# Patient Record
Sex: Male | Born: 1955 | Race: White | Hispanic: No | Smoking: Former smoker
Health system: Southern US, Community
[De-identification: ages and names within clinical notes are randomized; demographics above are authoritative.]

## PROBLEM LIST (undated history)

## (undated) DIAGNOSIS — R7303 Prediabetes: Secondary | ICD-10-CM

## (undated) DIAGNOSIS — I4891 Unspecified atrial fibrillation: Secondary | ICD-10-CM

## (undated) DIAGNOSIS — J449 Chronic obstructive pulmonary disease, unspecified: Secondary | ICD-10-CM

## (undated) DIAGNOSIS — K219 Gastro-esophageal reflux disease without esophagitis: Secondary | ICD-10-CM

## (undated) DIAGNOSIS — I2699 Other pulmonary embolism without acute cor pulmonale: Secondary | ICD-10-CM

## (undated) DIAGNOSIS — I10 Essential (primary) hypertension: Secondary | ICD-10-CM

## (undated) HISTORY — PX: CARDIOVERSION: SHX1299

## (undated) HISTORY — PX: ANKLE ARTHROSCOPY: SUR85

## (undated) HISTORY — DX: Other pulmonary embolism without acute cor pulmonale: I26.99

## (undated) HISTORY — PX: NISSEN FUNDOPLICATION: SHX2091

## (undated) HISTORY — DX: Unspecified atrial fibrillation: I48.91

---

## 1999-11-01 HISTORY — PX: ANKLE ARTHROSCOPY: SUR85

## 2004-10-31 HISTORY — PX: NISSEN FUNDOPLICATION: SHX2091

## 2004-11-23 ENCOUNTER — Ambulatory Visit: Payer: Self-pay | Admitting: Unknown Physician Specialty

## 2004-12-13 ENCOUNTER — Ambulatory Visit: Payer: Self-pay | Admitting: Unknown Physician Specialty

## 2005-01-07 ENCOUNTER — Ambulatory Visit: Payer: Self-pay | Admitting: Unknown Physician Specialty

## 2005-01-26 ENCOUNTER — Ambulatory Visit: Payer: Self-pay | Admitting: Unknown Physician Specialty

## 2005-06-06 ENCOUNTER — Ambulatory Visit: Payer: Self-pay | Admitting: Cardiology

## 2005-07-13 ENCOUNTER — Ambulatory Visit: Payer: Self-pay | Admitting: Surgery

## 2007-11-01 DIAGNOSIS — R569 Unspecified convulsions: Secondary | ICD-10-CM

## 2007-11-01 HISTORY — DX: Unspecified convulsions: R56.9

## 2007-11-01 HISTORY — PX: KNEE ARTHROSCOPY: SHX127

## 2012-05-31 ENCOUNTER — Ambulatory Visit: Payer: Self-pay | Admitting: Unknown Physician Specialty

## 2012-06-01 LAB — PATHOLOGY REPORT

## 2014-08-12 ENCOUNTER — Ambulatory Visit: Payer: Self-pay | Admitting: Family Medicine

## 2014-09-17 ENCOUNTER — Ambulatory Visit: Payer: Self-pay | Admitting: Unknown Physician Specialty

## 2014-09-23 DIAGNOSIS — F329 Major depressive disorder, single episode, unspecified: Secondary | ICD-10-CM | POA: Insufficient documentation

## 2014-09-23 DIAGNOSIS — K219 Gastro-esophageal reflux disease without esophagitis: Secondary | ICD-10-CM | POA: Insufficient documentation

## 2014-09-23 DIAGNOSIS — F32A Depression, unspecified: Secondary | ICD-10-CM | POA: Insufficient documentation

## 2014-09-29 DIAGNOSIS — E7800 Pure hypercholesterolemia, unspecified: Secondary | ICD-10-CM | POA: Insufficient documentation

## 2014-10-17 ENCOUNTER — Ambulatory Visit: Payer: Self-pay | Admitting: Unknown Physician Specialty

## 2014-10-23 DIAGNOSIS — M2391 Unspecified internal derangement of right knee: Secondary | ICD-10-CM | POA: Insufficient documentation

## 2015-10-02 DIAGNOSIS — R6889 Other general symptoms and signs: Secondary | ICD-10-CM | POA: Insufficient documentation

## 2016-05-28 ENCOUNTER — Emergency Department
Admission: EM | Admit: 2016-05-28 | Discharge: 2016-05-29 | Disposition: A | Discharge status: Home / Self Care | Payer: BLUE CROSS/BLUE SHIELD | Attending: Student | Admitting: Student

## 2016-05-28 ENCOUNTER — Encounter: Payer: Self-pay | Admitting: Emergency Medicine

## 2016-05-28 DIAGNOSIS — R109 Unspecified abdominal pain: Secondary | ICD-10-CM | POA: Diagnosis present

## 2016-05-28 DIAGNOSIS — N200 Calculus of kidney: Secondary | ICD-10-CM | POA: Insufficient documentation

## 2016-05-28 DIAGNOSIS — Z87891 Personal history of nicotine dependence: Secondary | ICD-10-CM | POA: Insufficient documentation

## 2016-05-28 HISTORY — DX: Gastro-esophageal reflux disease without esophagitis: K21.9

## 2016-05-28 MED ORDER — SODIUM CHLORIDE 0.9 % IV BOLUS (SEPSIS)
1000.0000 mL | Freq: Once | INTRAVENOUS | Status: AC
  Administered 2016-05-29: 1000 mL via INTRAVENOUS

## 2016-05-28 MED ORDER — ONDANSETRON HCL 4 MG/2ML IJ SOLN
4.0000 mg | Freq: Once | INTRAMUSCULAR | Status: AC
  Administered 2016-05-29: 4 mg via INTRAVENOUS

## 2016-05-28 MED ORDER — MORPHINE SULFATE (PF) 4 MG/ML IV SOLN
4.0000 mg | Freq: Once | INTRAVENOUS | Status: AC
Start: 2016-05-29 — End: 2016-05-29
  Administered 2016-05-29: 4 mg via INTRAVENOUS

## 2016-05-28 NOTE — ED Triage Notes (Signed)
Patient presents to Emergency Department via EMS with complaints of right side flank pain.  EMS gave fentanyl last dose at 2330.   Pt no hx of kidney stones.  Pt reports hx of Nisan Fundiplication,  GERD.    Pt sweaty and writhing in pain.

## 2016-05-29 ENCOUNTER — Emergency Department: Payer: BLUE CROSS/BLUE SHIELD

## 2016-05-29 LAB — URINALYSIS COMPLETE WITH MICROSCOPIC (ARMC ONLY)
BILIRUBIN URINE: NEGATIVE
Bacteria, UA: NONE SEEN
Glucose, UA: NEGATIVE mg/dL
KETONES UR: NEGATIVE mg/dL
Leukocytes, UA: NEGATIVE
NITRITE: NEGATIVE
PROTEIN: NEGATIVE mg/dL
SPECIFIC GRAVITY, URINE: 1.015 (ref 1.005–1.030)
Squamous Epithelial / LPF: NONE SEEN
pH: 5 (ref 5.0–8.0)

## 2016-05-29 LAB — COMPREHENSIVE METABOLIC PANEL
ALBUMIN: 4.1 g/dL (ref 3.5–5.0)
ALK PHOS: 36 U/L — AB (ref 38–126)
ALT: 16 U/L — ABNORMAL LOW (ref 17–63)
ANION GAP: 9 (ref 5–15)
AST: 21 U/L (ref 15–41)
BUN: 21 mg/dL — ABNORMAL HIGH (ref 6–20)
CALCIUM: 9 mg/dL (ref 8.9–10.3)
CO2: 23 mmol/L (ref 22–32)
Chloride: 110 mmol/L (ref 101–111)
Creatinine, Ser: 1.04 mg/dL (ref 0.61–1.24)
GFR calc Af Amer: >60 mL/min (ref >60)
GFR calc non Af Amer: >60 mL/min (ref >60)
GLUCOSE: 113 mg/dL — AB (ref 65–99)
Potassium: 3.7 mmol/L (ref 3.5–5.1)
Sodium: 142 mmol/L (ref 135–145)
Total Bilirubin: 0.6 mg/dL (ref 0.3–1.2)
Total Protein: 6.4 g/dL — ABNORMAL LOW (ref 6.5–8.1)

## 2016-05-29 LAB — CBC WITH DIFFERENTIAL/PLATELET
Basophils Absolute: 0.1 10*3/uL (ref 0–0.1)
Basophils Relative: 1 %
EOS PCT: 2 %
Eosinophils Absolute: 0.2 10*3/uL (ref 0–0.7)
HEMATOCRIT: 41.1 % (ref 40.0–52.0)
Hemoglobin: 14.3 g/dL (ref 13.0–18.0)
LYMPHS ABS: 1.9 10*3/uL (ref 1.0–3.6)
LYMPHS PCT: 16 %
MCH: 30.5 pg (ref 26.0–34.0)
MCHC: 34.8 g/dL (ref 32.0–36.0)
MCV: 87.5 fL (ref 80.0–100.0)
MONO ABS: 0.8 10*3/uL (ref 0.2–1.0)
MONOS PCT: 7 %
NEUTROS ABS: 8.4 10*3/uL — AB (ref 1.4–6.5)
Neutrophils Relative %: 74 %
PLATELETS: 204 10*3/uL (ref 150–440)
RBC: 4.69 MIL/uL (ref 4.40–5.90)
RDW: 12.2 % (ref 11.5–14.5)
WBC: 11.4 10*3/uL — ABNORMAL HIGH (ref 3.8–10.6)

## 2016-05-29 LAB — LIPASE, BLOOD: Lipase: 22 U/L (ref 11–51)

## 2016-05-29 MED ORDER — ONDANSETRON 4 MG PO TBDP: 4.0000 mg | ORAL_TABLET | Freq: Three times a day (TID) | ORAL | 0 refills | Status: DC | PRN

## 2016-05-29 MED ORDER — OXYCODONE HCL 5 MG PO TABS: 5.0000 mg | ORAL_TABLET | Freq: Four times a day (QID) | ORAL | 0 refills | Status: DC | PRN

## 2016-05-29 MED ORDER — ONDANSETRON HCL 4 MG/2ML IJ SOLN
INTRAMUSCULAR | Status: AC
Start: 2016-05-29 — End: 2016-05-29
  Administered 2016-05-29: 4 mg via INTRAVENOUS
  Filled 2016-05-29: qty 2

## 2016-05-29 MED ORDER — KETOROLAC TROMETHAMINE 30 MG/ML IJ SOLN
15.0000 mg | Freq: Once | INTRAMUSCULAR | Status: AC
  Administered 2016-05-29: 15 mg via INTRAVENOUS
  Filled 2016-05-29: qty 1

## 2016-05-29 MED ORDER — MORPHINE SULFATE (PF) 4 MG/ML IV SOLN
INTRAVENOUS | Status: AC
  Administered 2016-05-29: 4 mg via INTRAVENOUS
  Filled 2016-05-29: qty 1

## 2016-05-29 MED ORDER — TAMSULOSIN HCL 0.4 MG PO CAPS: 0.4000 mg | ORAL_CAPSULE | Freq: Every day | ORAL | 0 refills | Status: DC

## 2016-05-29 NOTE — ED Provider Notes (Signed)
[facility] Emergency Department Provider Note   ____________________________________________   First MD Initiated Contact with Patient 05/29/16 0008     (approximate)  I have reviewed the triage vital signs and the nursing notes.   HISTORY  Chief Complaint Flank Pain    HPI Dwayne James is a 60 y.o. male with history of GERD presents for evaluation of severe sudden onset right flank pain this evening, gradual onset, radiating into the right abdomen, constant, severe, no modifying factors. He has had nausea but no vomiting, diarrhea, fevers or chills. No chest pain or difficulty breathing. No symptoms like this previously.   Past Medical History:  Diagnosis Date  . GERD (gastroesophageal reflux disease)     There are no active problems to display for this patient.   Past Surgical History:  Procedure Laterality Date  . ANKLE ARTHROSCOPY    . NISSEN FUNDOPLICATION      Prior to Admission medications   Not on File    Allergies Review of patient's allergies indicates no known allergies.  History reviewed. No pertinent family history.  Social History Social History  Substance Use Topics  . Smoking status: Former Smoker    Quit date: 05/28/2004  . Smokeless tobacco: Never Used  . Alcohol use No    Review of Systems Constitutional: No fever/chills Eyes: No visual changes. ENT: No sore throat. Cardiovascular: Denies chest pain. Respiratory: Denies shortness of breath. Gastrointestinal: No abdominal pain.  + nausea, no vomiting.  No diarrhea.  No constipation. Genitourinary: Negative for dysuria. Musculoskeletal: Positive for flank pain. Skin: Negative for rash. Neurological: Negative for headaches, focal weakness or numbness.  10-point ROS otherwise negative.  ____________________________________________   PHYSICAL EXAM:  VITAL SIGNS: ED Triage Vitals  Enc Vitals Group     BP 05/28/16 2338 (!) 159/84     Pulse Rate  05/28/16 2338 60     Resp 05/28/16 2338 20     Temp 05/28/16 2338 98.2 F (36.8 C)     Temp Source 05/28/16 2338 Oral     SpO2 05/28/16 2338 100 %     Weight 05/28/16 2338 228 lb (103.4 kg)     Height 05/28/16 2338 5\' 8"  (1.727 m)     Head Circumference --      Peak Flow --      Pain Score 05/28/16 2339 10     Pain Loc --      Pain Edu? --      Excl. in GC? --     Constitutional: Alert and oriented. In severe distress second to pain, writhing in bed unable to find a comfortable position. Eyes: Conjunctivae are normal. PERRL. EOMI. Head: Atraumatic. Nose: No congestion/rhinnorhea. Mouth/Throat: Mucous membranes are moist.  Oropharynx non-erythematous. Neck: No stridor. Cardiovascular: Normal rate, regular rhythm. Grossly normal heart sounds.  Good peripheral circulation. Respiratory: Normal respiratory effort.  No retractions. Lungs CTAB. Gastrointestinal: Soft and nontender. No distention. No abdominal bruits. No CVA tenderness. Genitourinary: deferred Musculoskeletal: No lower extremity tenderness nor edema.  No joint effusions. Neurologic:  Normal speech and language. No gross focal neurologic deficits are appreciated. No gait instability. Skin:  Skin is warm, dry and intact. No rash noted. Psychiatric: Mood and affect are normal. Speech and behavior are normal.  ____________________________________________   LABS (all labs ordered are listed, but only abnormal results are displayed)  Labs Reviewed  URINALYSIS COMPLETEWITH MICROSCOPIC ([facility] ONLY) - Abnormal; Notable for the following:       Result Value  Color, Urine YELLOW (*)    APPearance CLEAR (*)    Hgb urine dipstick 2+ (*)    All other components within normal limits  CBC WITH DIFFERENTIAL/PLATELET - Abnormal; Notable for the following:    WBC 11.4 (*)    Neutro Abs 8.4 (*)    All other components within normal limits  COMPREHENSIVE METABOLIC PANEL - Abnormal; Notable for the following:    Glucose, Bld 113  (*)    BUN 21 (*)    Total Protein 6.4 (*)    ALT 16 (*)    Alkaline Phosphatase 36 (*)    All other components within normal limits  LIPASE, BLOOD   ____________________________________________  EKG  none ____________________________________________  RADIOLOGY  CT abdomen and pelvis IMPRESSION: 1. There is right renal swelling and perinephric stranding, but no hydronephrosis or hydroureter. No renal or ureteral stone. Findings could reflect a recently passed stone. Alternatively, findings may reflect pyelonephritis. No other evidence of an acute abnormality. 2. No other significant findings. ____________________________________________   PROCEDURES  Procedure(s) performed: None  Procedures  Critical Care performed: No  ____________________________________________   INITIAL IMPRESSION / ASSESSMENT AND PLAN / ED COURSE  Pertinent labs & imaging results that were available during my care of the patient were reviewed by me and considered in my medical decision making (see chart for details).  Dwayne James is a 60 y.o. male with history of GERD presents for evaluation of severe sudden onset right flank pain this evening, gradual onset, radiating into the right abdomen. On exam, he appears to be in distress secondary to pain. Vital signs stable, he is afebrile. Clinical concern is for possible kidney stone. We'll treat symptomatically, obtain labs, CT abdomen and pelvis, UA and reassess for disposition.  ----------------------------------------- 3:34 AM on 05/29/2016 ----------------------------------------- Patient reports he is feeling much better at this time after 1 dose of morphine followed by a dose of Toradol as well as IV fluids. He is requesting discharge. CBC with mild leukocytosis, normal lipase, and generally unremarkable CMP. Urinalysis with 6-30 red blood cells, no leukocytes sites, nitrites and no bacteria, not consistent with infection. I suspect  this blood is secondary to a passed  kidney stone as CT scan shows some perinephric stranding consistent with that, though pyelonephritis is not excluded on the CT scan. Urinalysis is not consistent with infection and I doubt this represents pyelonephritis. We'll discharge with pain control, Flomax, close urology follow-up. We discussed return precautions and the patient is comfortable with the discharge plan. DC home.   Clinical Course     ____________________________________________   FINAL CLINICAL IMPRESSION(S) / ED DIAGNOSES  Final diagnoses:  Right flank pain  Nephrolithiasis      NEW MEDICATIONS STARTED DURING THIS VISIT:  New Prescriptions   No medications on file     Note:  This document was prepared using Dragon voice recognition software and may include unintentional dictation errors.    Gayla Doss, MD 05/29/16 [phone]

## 2017-06-08 DIAGNOSIS — M545 Low back pain, unspecified: Secondary | ICD-10-CM | POA: Insufficient documentation

## 2017-06-08 DIAGNOSIS — F1190 Opioid use, unspecified, uncomplicated: Secondary | ICD-10-CM | POA: Insufficient documentation

## 2017-06-08 DIAGNOSIS — G8929 Other chronic pain: Secondary | ICD-10-CM | POA: Insufficient documentation

## 2017-06-08 DIAGNOSIS — M722 Plantar fascial fibromatosis: Secondary | ICD-10-CM | POA: Insufficient documentation

## 2017-06-13 DIAGNOSIS — R7302 Impaired glucose tolerance (oral): Secondary | ICD-10-CM | POA: Insufficient documentation

## 2017-06-15 DIAGNOSIS — Z9114 Patient's other noncompliance with medication regimen: Secondary | ICD-10-CM | POA: Insufficient documentation

## 2017-08-04 ENCOUNTER — Ambulatory Visit (INDEPENDENT_AMBULATORY_CARE_PROVIDER_SITE_OTHER): Payer: BLUE CROSS/BLUE SHIELD

## 2017-08-04 ENCOUNTER — Other Ambulatory Visit: Payer: Self-pay

## 2017-08-04 ENCOUNTER — Ambulatory Visit (INDEPENDENT_AMBULATORY_CARE_PROVIDER_SITE_OTHER): Payer: BLUE CROSS/BLUE SHIELD | Admitting: Podiatry

## 2017-08-04 VITALS — BP 139/94 | HR 76 | Temp 97.8°F

## 2017-08-04 DIAGNOSIS — M79673 Pain in unspecified foot: Secondary | ICD-10-CM | POA: Diagnosis not present

## 2017-08-04 DIAGNOSIS — G5761 Lesion of plantar nerve, right lower limb: Secondary | ICD-10-CM

## 2017-08-04 DIAGNOSIS — M722 Plantar fascial fibromatosis: Secondary | ICD-10-CM | POA: Diagnosis not present

## 2017-08-04 MED ORDER — DICLOFENAC SODIUM 75 MG PO TBEC: 75.0000 mg | DELAYED_RELEASE_TABLET | Freq: Two times a day (BID) | ORAL | 1 refills | Status: DC

## 2017-08-07 NOTE — Progress Notes (Signed)
   Subjective: Patient presents today for intermittent pain and tenderness in the left heel, arch and forefoot that began 3-4 months ago. He also reports intermittent pain to the right forefoot for the past week. He states the left foot hurts more than the right. Patient states that it hurts in the mornings with the first steps out of bed. Patient presents today for further treatment and evaluation.   Past Medical History:  Diagnosis Date  . GERD (gastroesophageal reflux disease)     Objective: Physical Exam General: The patient is alert and oriented x3 in no acute distress.  Dermatology: Skin is warm, dry and supple bilateral lower extremities. Negative for open lesions or macerations bilateral.   Vascular: Dorsalis Pedis and Posterior Tibial pulses palpable bilateral.  Capillary fill time is immediate to all digits.  Neurological: Epicritic and protective threshold intact bilateral.   Musculoskeletal: Tenderness to palpation at the medial calcaneal tubercale and through the insertion of the plantar fascia of the left foot.  Sharp pain with palpation of the 2nd interspace of the right foot and lateral compression of the metatarsal heads consistent with neuroma. Positive Lendell Caprice sign with loadbearing of the forefoot. All other joints range of motion within normal limits bilateral. Strength 5/5 in all groups bilateral.   Radiographic exam:   Normal osseous mineralization. Joint spaces preserved. No fracture/dislocation/boney destruction. Calcaneal spur present with mild thickening of plantar fascia left. No other soft tissue abnormalities or radiopaque foreign bodies.   Assessment: 1. Plantar fasciitis left foot 2. Morton's neuroma right second interspace  Plan of Care:  1. Patient evaluated. Xrays reviewed.   2. Injection of 0.5cc Celestone soluspan injected into the left plantar fascia.  3. Injection of 0.5 mL Celestone Soluspan injected into the second interspace of the right  foot in the neuroma. 4. Rx for Diclofenac  PO BID ordered for patient. 5. Plantar fascial band(s) dispensed  6. Instructed patient regarding therapies and modalities at home to alleviate symptoms.  7. Return to clinic in 4 weeks.     Felecia Shelling, DPM [facility]  Dr. Felecia Shelling, DPM    [address] [place], [place] [address]                Office [phone]  Fax [phone]

## 2017-08-15 MED ORDER — BETAMETHASONE SOD PHOS & ACET 6 (3-3) MG/ML IJ SUSP: 3.0000 mg | Freq: Once | INTRAMUSCULAR | Status: DC

## 2017-09-01 ENCOUNTER — Encounter: Payer: Self-pay | Admitting: Podiatry

## 2017-09-01 ENCOUNTER — Ambulatory Visit (INDEPENDENT_AMBULATORY_CARE_PROVIDER_SITE_OTHER): Payer: BLUE CROSS/BLUE SHIELD | Admitting: Podiatry

## 2017-09-01 DIAGNOSIS — M722 Plantar fascial fibromatosis: Secondary | ICD-10-CM

## 2017-09-04 NOTE — Progress Notes (Signed)
   Subjective: Patient presents today for follow up evaluation of left plantar fasciitis. He states the injection he received previously helped alleviate the pain for about 1.5 weeks but it then returned. He has not taken Diclofenac. Patient presents today for further treatment and evaluation.   Past Medical History:  Diagnosis Date  . GERD (gastroesophageal reflux disease)     Objective: Physical Exam General: The patient is alert and oriented x3 in no acute distress.  Dermatology: Skin is warm, dry and supple bilateral lower extremities. Negative for open lesions or macerations bilateral.   Vascular: Dorsalis Pedis and Posterior Tibial pulses palpable bilateral.  Capillary fill time is immediate to all digits.  Neurological: Epicritic and protective threshold intact bilateral.   Musculoskeletal: Tenderness to palpation at the medial calcaneal tubercale and through the insertion of the plantar fascia of the left foot. All other joints range of motion within normal limits bilateral. Strength 5/5 in all groups bilateral.    Assessment: 1. Plantar fasciitis left foot  Plan of Care:  1. Patient evaluated.  2. Injection of 0.5cc Celestone soluspan injected into the left plantar fascia.  3. Continue wearing fascial brace. 4. Pt forgot to pick up Diclofenac from pharmacy. Pick up and begin taking as directed. 5. Continue orthoheel shoes. 6. Return to clinic in 4 weeks.   Felecia ShellingBrent M. Cardell Rachel, DPM [facility]  Dr. Felecia ShellingBrent M. Kharis Lapenna, DPM    [address]                                     [place], [place] [address]                Office [phone]  Fax [phone]) [phone]

## 2017-09-06 MED ORDER — BETAMETHASONE SOD PHOS & ACET 6 (3-3) MG/ML IJ SUSP: 3.0000 mg | Freq: Once | INTRAMUSCULAR | Status: DC

## 2017-09-29 ENCOUNTER — Encounter: Payer: Self-pay | Admitting: Podiatry

## 2017-09-29 ENCOUNTER — Ambulatory Visit (INDEPENDENT_AMBULATORY_CARE_PROVIDER_SITE_OTHER): Payer: BLUE CROSS/BLUE SHIELD | Admitting: Podiatry

## 2017-09-29 DIAGNOSIS — M21862 Other specified acquired deformities of left lower leg: Secondary | ICD-10-CM

## 2017-09-29 DIAGNOSIS — M722 Plantar fascial fibromatosis: Secondary | ICD-10-CM | POA: Diagnosis not present

## 2017-09-29 DIAGNOSIS — M216X2 Other acquired deformities of left foot: Secondary | ICD-10-CM

## 2017-10-02 NOTE — Progress Notes (Signed)
   Subjective: Patient presents today for follow up evaluation of left plantar fasciitis. He states his pain is relatively unchanged. He states the injection helped for about 3 weeks but has since returned. He reports taking the Diclofenac as directed with no significant relief. He states he believes he is about 20% better. Patient presents today for further treatment and evaluation.   Past Medical History:  Diagnosis Date  . GERD (gastroesophageal reflux disease)     Objective: Physical Exam General: The patient is alert and oriented x3 in no acute distress.  Dermatology: Skin is warm, dry and supple bilateral lower extremities. Negative for open lesions or macerations bilateral.   Vascular: Dorsalis Pedis and Posterior Tibial pulses palpable bilateral.  Capillary fill time is immediate to all digits.  Neurological: Epicritic and protective threshold intact bilateral.   Musculoskeletal: Tenderness to palpation at the medial calcaneal tubercale and through the insertion of the plantar fascia of the left foot. Limited left ankle dorsiflexion consistent with gastroc equinus. All other joints range of motion within normal limits bilateral. Strength 5/5 in all groups bilateral.    Assessment: 1. Plantar fasciitis left foot 2. Left gastroc equinus   Plan of Care:  1. Patient evaluated.  2. Injection of 0.5cc Celestone soluspan injected into the left plantar fascia.  3. Continue wearing fascial brace, taking Diclofenac and wearing Orthofeet shoes. 4. Night splint dispensed. 5. Discussed possibility of surgery in the future.  6. Return to clinic in 6 weeks.    Felecia ShellingBrent M. Evans, DPM [facility]  Dr. Felecia ShellingBrent M. Evans, DPM    [address]                                     [place], [place] [address]                Office [phone]  Fax [phone]

## 2017-10-27 ENCOUNTER — Encounter: Payer: Self-pay | Admitting: Podiatry

## 2017-10-27 ENCOUNTER — Ambulatory Visit (INDEPENDENT_AMBULATORY_CARE_PROVIDER_SITE_OTHER): Payer: BLUE CROSS/BLUE SHIELD | Admitting: Podiatry

## 2017-10-27 DIAGNOSIS — M722 Plantar fascial fibromatosis: Secondary | ICD-10-CM | POA: Diagnosis not present

## 2017-11-02 NOTE — Progress Notes (Signed)
   Subjective: Patient presents today for follow-up evaluation of left plantar fasciitis.  He states the pain has improved slightly but is still present in the left heel.  He rates the pain at 4/10 currently.  Patient presents today for further treatment and evaluation.  Past Medical History:  Diagnosis Date  . GERD (gastroesophageal reflux disease)      Objective: Physical Exam General: The patient is alert and oriented x3 in no acute distress.  Dermatology: Skin is warm, dry and supple bilateral lower extremities. Negative for open lesions or macerations bilateral.   Vascular: Dorsalis Pedis and Posterior Tibial pulses palpable bilateral.  Capillary fill time is immediate to all digits.  Neurological: Epicritic and protective threshold intact bilateral.   Musculoskeletal: Tenderness to palpation at the medial calcaneal tubercale and through the insertion of the plantar fascia of the left foot. All other joints range of motion within normal limits bilateral. Strength 5/5 in all groups bilateral.   Assessment: 1. Plantar fasciitis left foot  Plan of Care:  1. Patient evaluated. 2. Injection of 0.5cc Celestone soluspan injected into the left plantar fascia.  3.  Scanned for custom molded orthotics today. 4.  Continue wearing night splint and plantar fascial brace. 5.  Return to clinic in 4 weeks for orthotics pickup.   Felecia ShellingBrent M. Glennon Kopko, DPM [facility]  Dr. Felecia ShellingBrent M. Keiondre Colee, DPM    [address]                                     [place], [place] [address]                Office [phone]  Fax [phone]

## 2017-11-03 ENCOUNTER — Ambulatory Visit: Payer: BLUE CROSS/BLUE SHIELD | Admitting: Podiatry

## 2017-11-22 ENCOUNTER — Encounter: Payer: BLUE CROSS/BLUE SHIELD | Admitting: Orthotics

## 2017-11-29 ENCOUNTER — Encounter: Payer: BLUE CROSS/BLUE SHIELD | Admitting: Orthotics

## 2018-06-18 ENCOUNTER — Ambulatory Visit (INDEPENDENT_AMBULATORY_CARE_PROVIDER_SITE_OTHER): Payer: BLUE CROSS/BLUE SHIELD | Admitting: Podiatry

## 2018-06-18 ENCOUNTER — Encounter: Payer: Self-pay | Admitting: Podiatry

## 2018-06-18 DIAGNOSIS — M722 Plantar fascial fibromatosis: Secondary | ICD-10-CM

## 2018-06-18 NOTE — Progress Notes (Signed)
He presents today for follow-up of plantar fasciitis.  States that it is significantly painful and has flared up into the left heel.  Her graph objective: Vital signs stable he is alert and oriented x3.  Pulses are palpable.  Has pain on central calcaneal tubercle of the left heel upon palpation.  Assessment: Plantar fasciitis central calcaneal tubercle left heel.  Plan: I injected the area today from the medial aspect 20 mg Kenalog 5 mg Marcaine point maximal tenderness.  Tolerated procedure well.  Follow-up with me in 1 month if necessary.

## 2018-10-16 ENCOUNTER — Encounter
Admission: EM | Disposition: A | Discharge status: Home / Self Care | Payer: Self-pay | Source: Home / Self Care | Attending: Specialist

## 2018-10-16 ENCOUNTER — Other Ambulatory Visit: Payer: Self-pay

## 2018-10-16 ENCOUNTER — Inpatient Hospital Stay
Admit: 2018-10-16 | Discharge: 2018-10-16 | Disposition: A | Discharge status: Home / Self Care | Payer: BLUE CROSS/BLUE SHIELD | Attending: Vascular Surgery | Admitting: Vascular Surgery

## 2018-10-16 ENCOUNTER — Inpatient Hospital Stay
Admission: EM | Admit: 2018-10-16 | Discharge: 2018-10-17 | DRG: 164 | Disposition: A | Discharge status: Home / Self Care | Payer: BLUE CROSS/BLUE SHIELD | Attending: Specialist | Admitting: Specialist

## 2018-10-16 ENCOUNTER — Emergency Department: Payer: BLUE CROSS/BLUE SHIELD

## 2018-10-16 ENCOUNTER — Other Ambulatory Visit (INDEPENDENT_AMBULATORY_CARE_PROVIDER_SITE_OTHER): Payer: Self-pay | Admitting: Vascular Surgery

## 2018-10-16 ENCOUNTER — Encounter: Payer: Self-pay | Admitting: Emergency Medicine

## 2018-10-16 DIAGNOSIS — E7800 Pure hypercholesterolemia, unspecified: Secondary | ICD-10-CM | POA: Diagnosis present

## 2018-10-16 DIAGNOSIS — I2609 Other pulmonary embolism with acute cor pulmonale: Secondary | ICD-10-CM | POA: Diagnosis not present

## 2018-10-16 DIAGNOSIS — I2699 Other pulmonary embolism without acute cor pulmonale: Secondary | ICD-10-CM | POA: Diagnosis present

## 2018-10-16 DIAGNOSIS — J449 Chronic obstructive pulmonary disease, unspecified: Secondary | ICD-10-CM | POA: Diagnosis present

## 2018-10-16 DIAGNOSIS — R0902 Hypoxemia: Secondary | ICD-10-CM

## 2018-10-16 DIAGNOSIS — Z79891 Long term (current) use of opiate analgesic: Secondary | ICD-10-CM | POA: Diagnosis not present

## 2018-10-16 DIAGNOSIS — F909 Attention-deficit hyperactivity disorder, unspecified type: Secondary | ICD-10-CM | POA: Diagnosis present

## 2018-10-16 DIAGNOSIS — Z79899 Other long term (current) drug therapy: Secondary | ICD-10-CM

## 2018-10-16 DIAGNOSIS — K219 Gastro-esophageal reflux disease without esophagitis: Secondary | ICD-10-CM | POA: Diagnosis present

## 2018-10-16 DIAGNOSIS — Z6841 Body Mass Index (BMI) 40.0 and over, adult: Secondary | ICD-10-CM

## 2018-10-16 DIAGNOSIS — R0602 Shortness of breath: Secondary | ICD-10-CM

## 2018-10-16 DIAGNOSIS — Z87891 Personal history of nicotine dependence: Secondary | ICD-10-CM

## 2018-10-16 DIAGNOSIS — E669 Obesity, unspecified: Secondary | ICD-10-CM | POA: Diagnosis present

## 2018-10-16 DIAGNOSIS — R7302 Impaired glucose tolerance (oral): Secondary | ICD-10-CM | POA: Diagnosis present

## 2018-10-16 DIAGNOSIS — I519 Heart disease, unspecified: Secondary | ICD-10-CM

## 2018-10-16 HISTORY — DX: Chronic obstructive pulmonary disease, unspecified: J44.9

## 2018-10-16 HISTORY — PX: PULMONARY THROMBECTOMY: CATH118295

## 2018-10-16 LAB — COMPREHENSIVE METABOLIC PANEL
ALT: 18 U/L (ref 0–44)
AST: 24 U/L (ref 15–41)
Albumin: 4 g/dL (ref 3.5–5.0)
Alkaline Phosphatase: 38 U/L (ref 38–126)
Anion gap: 7 (ref 5–15)
BILIRUBIN TOTAL: 0.8 mg/dL (ref 0.3–1.2)
BUN: 17 mg/dL (ref 8–23)
CHLORIDE: 107 mmol/L (ref 98–111)
CO2: 23 mmol/L (ref 22–32)
CREATININE: 0.77 mg/dL (ref 0.61–1.24)
Calcium: 8.3 mg/dL — ABNORMAL LOW (ref 8.9–10.3)
Glucose, Bld: 126 mg/dL — ABNORMAL HIGH (ref 70–99)
POTASSIUM: 4.1 mmol/L (ref 3.5–5.1)
Sodium: 137 mmol/L (ref 135–145)
TOTAL PROTEIN: 6.7 g/dL (ref 6.5–8.1)

## 2018-10-16 LAB — TROPONIN I: Troponin I: <0.03 ng/mL (ref <0.03)

## 2018-10-16 LAB — URINE DRUG SCREEN, QUALITATIVE (ARMC ONLY)
Amphetamines, Ur Screen: NOT DETECTED
Barbiturates, Ur Screen: NOT DETECTED
Benzodiazepine, Ur Scrn: NOT DETECTED
Cannabinoid 50 Ng, Ur ~~LOC~~: NOT DETECTED
Cocaine Metabolite,Ur ~~LOC~~: NOT DETECTED
MDMA (Ecstasy)Ur Screen: NOT DETECTED
Methadone Scn, Ur: NOT DETECTED
Opiate, Ur Screen: POSITIVE — AB
Phencyclidine (PCP) Ur S: NOT DETECTED
Tricyclic, Ur Screen: NOT DETECTED

## 2018-10-16 LAB — CBC
HCT: 42 % (ref 39.0–52.0)
Hemoglobin: 14.1 g/dL (ref 13.0–17.0)
MCH: 31.1 pg (ref 26.0–34.0)
MCHC: 33.6 g/dL (ref 30.0–36.0)
MCV: 92.5 fL (ref 80.0–100.0)
PLATELETS: 223 10*3/uL (ref 150–400)
RBC: 4.54 MIL/uL (ref 4.22–5.81)
RDW: 11.8 % (ref 11.5–15.5)
WBC: 7.1 10*3/uL (ref 4.0–10.5)
nRBC: 0 % (ref 0.0–0.2)

## 2018-10-16 LAB — PROTIME-INR
INR: 1.07
Prothrombin Time: 13.8 seconds (ref 11.4–15.2)

## 2018-10-16 LAB — BLOOD GAS, VENOUS
ACID-BASE EXCESS: 2 mmol/L (ref 0.0–2.0)
BICARBONATE: 25.6 mmol/L (ref 20.0–28.0)
O2 Saturation: 96.8 %
PCO2 VEN: 36 mmHg — AB (ref 44.0–60.0)
PH VEN: 7.46 — AB (ref 7.250–7.430)
Patient temperature: 37
pO2, Ven: 84 mmHg — ABNORMAL HIGH (ref 32.0–45.0)

## 2018-10-16 LAB — HEPARIN LEVEL (UNFRACTIONATED): Heparin Unfractionated: 0.23 IU/mL — ABNORMAL LOW (ref 0.30–0.70)

## 2018-10-16 LAB — BRAIN NATRIURETIC PEPTIDE: B Natriuretic Peptide: 50 pg/mL (ref 0.0–100.0)

## 2018-10-16 LAB — APTT: aPTT: 30 seconds (ref 24–36)

## 2018-10-16 SURGERY — PULMONARY THROMBECTOMY
Anesthesia: Moderate Sedation

## 2018-10-16 MED ORDER — MIDAZOLAM HCL 5 MG/5ML IJ SOLN
INTRAMUSCULAR | Status: AC
  Filled 2018-10-16: qty 5

## 2018-10-16 MED ORDER — ALTEPLASE 2 MG IJ SOLR
INTRAMUSCULAR | Status: AC
  Filled 2018-10-16: qty 8

## 2018-10-16 MED ORDER — FAMOTIDINE 20 MG PO TABS: 40.0000 mg | ORAL_TABLET | ORAL | Status: DC | PRN

## 2018-10-16 MED ORDER — SODIUM CHLORIDE 0.9 % IV SOLN
INTRAVENOUS | Status: AC
  Administered 2018-10-16: 18:00:00 via INTRAVENOUS

## 2018-10-16 MED ORDER — HYDROMORPHONE HCL 1 MG/ML IJ SOLN: 1.0000 mg | Freq: Once | INTRAMUSCULAR | Status: DC | PRN

## 2018-10-16 MED ORDER — FENTANYL CITRATE (PF) 100 MCG/2ML IJ SOLN
INTRAMUSCULAR | Status: DC | PRN
  Administered 2018-10-16 (×4): 50 ug via INTRAVENOUS

## 2018-10-16 MED ORDER — LIDOCAINE-EPINEPHRINE (PF) 1 %-1:200000 IJ SOLN
INTRAMUSCULAR | Status: AC
  Filled 2018-10-16: qty 10

## 2018-10-16 MED ORDER — IOPAMIDOL (ISOVUE-370) INJECTION 76%
75.0000 mL | Freq: Once | INTRAVENOUS | Status: AC | PRN
  Administered 2018-10-16: 75 mL via INTRAVENOUS

## 2018-10-16 MED ORDER — FENTANYL CITRATE (PF) 100 MCG/2ML IJ SOLN
INTRAMUSCULAR | Status: AC
  Filled 2018-10-16: qty 2

## 2018-10-16 MED ORDER — HEPARIN (PORCINE) 25000 UT/250ML-% IV SOLN
2000.0000 [IU]/h | INTRAVENOUS | Status: AC
  Administered 2018-10-16: 1500 [IU]/h via INTRAVENOUS
  Administered 2018-10-17: 1700 [IU]/h via INTRAVENOUS
  Filled 2018-10-16 (×3): qty 250

## 2018-10-16 MED ORDER — MIDAZOLAM HCL 2 MG/2ML IJ SOLN
INTRAMUSCULAR | Status: DC | PRN
  Administered 2018-10-16: 2 mg via INTRAVENOUS
  Administered 2018-10-16 (×3): 1 mg via INTRAVENOUS

## 2018-10-16 MED ORDER — CEFAZOLIN SODIUM-DEXTROSE 2-4 GM/100ML-% IV SOLN
2.0000 g | Freq: Once | INTRAVENOUS | Status: AC
  Administered 2018-10-16: 2 g via INTRAVENOUS

## 2018-10-16 MED ORDER — HEPARIN (PORCINE) 25000 UT/250ML-% IV SOLN: 12.0000 [IU]/kg/h | INTRAVENOUS | Status: DC

## 2018-10-16 MED ORDER — LIDOCAINE HCL (PF) 1 % IJ SOLN
INTRAMUSCULAR | Status: AC
  Filled 2018-10-16: qty 30

## 2018-10-16 MED ORDER — HEPARIN (PORCINE) IN NACL 1000-0.9 UT/500ML-% IV SOLN
INTRAVENOUS | Status: AC
  Filled 2018-10-16: qty 1000

## 2018-10-16 MED ORDER — OXYCODONE HCL 5 MG PO TABS: 5.0000 mg | ORAL_TABLET | ORAL | Status: DC | PRN

## 2018-10-16 MED ORDER — ONDANSETRON HCL 4 MG/2ML IJ SOLN: 4.0000 mg | Freq: Four times a day (QID) | INTRAMUSCULAR | Status: DC | PRN

## 2018-10-16 MED ORDER — SODIUM CHLORIDE 0.9 % IV SOLN: 250.0000 mL | INTRAVENOUS | Status: DC | PRN

## 2018-10-16 MED ORDER — HEPARIN SODIUM (PORCINE) 1000 UNIT/ML IJ SOLN
INTRAMUSCULAR | Status: AC
  Filled 2018-10-16: qty 1

## 2018-10-16 MED ORDER — ACETAMINOPHEN 325 MG PO TABS: 650.0000 mg | ORAL_TABLET | ORAL | Status: DC | PRN

## 2018-10-16 MED ORDER — HEPARIN SODIUM (PORCINE) 1000 UNIT/ML IJ SOLN
INTRAMUSCULAR | Status: DC | PRN
  Administered 2018-10-16: 1000 [IU] via INTRAVENOUS

## 2018-10-16 MED ORDER — MORPHINE SULFATE (PF) 2 MG/ML IV SOLN: 2.0000 mg | INTRAVENOUS | Status: DC | PRN

## 2018-10-16 MED ORDER — IOPAMIDOL (ISOVUE-300) INJECTION 61%
INTRAVENOUS | Status: DC | PRN
  Administered 2018-10-16: 60 mL via INTRA_ARTERIAL

## 2018-10-16 MED ORDER — SODIUM CHLORIDE 0.9% FLUSH: 3.0000 mL | INTRAVENOUS | Status: DC | PRN

## 2018-10-16 MED ORDER — HEPARIN SODIUM (PORCINE) 5000 UNIT/ML IJ SOLN: 4000.0000 [IU] | Freq: Once | INTRAMUSCULAR | Status: DC

## 2018-10-16 MED ORDER — SODIUM CHLORIDE 0.9% FLUSH
3.0000 mL | Freq: Two times a day (BID) | INTRAVENOUS | Status: DC
  Administered 2018-10-17: 3 mL via INTRAVENOUS

## 2018-10-16 MED ORDER — ALTEPLASE 2 MG IJ SOLR
INTRAMUSCULAR | Status: DC | PRN
  Administered 2018-10-16 (×2): 4 mg

## 2018-10-16 MED ORDER — METHYLPREDNISOLONE SODIUM SUCC 125 MG IJ SOLR: 125.0000 mg | INTRAMUSCULAR | Status: DC | PRN

## 2018-10-16 MED ORDER — HEPARIN BOLUS VIA INFUSION
4800.0000 [IU] | Freq: Once | INTRAVENOUS | Status: AC
  Administered 2018-10-16: 4800 [IU] via INTRAVENOUS
  Filled 2018-10-16: qty 4800

## 2018-10-16 MED ORDER — SODIUM CHLORIDE 0.9 % IV SOLN
INTRAVENOUS | Status: DC
  Administered 2018-10-16: 15:00:00 via INTRAVENOUS

## 2018-10-16 SURGICAL SUPPLY — 13 items
CANISTER PENUMBRA ENGINE (MISCELLANEOUS) ×2 IMPLANT
CATH ANGIO 5F 100CM .035 PIG (CATHETERS) ×2 IMPLANT
CATH INDIGO 8XTORQ 115 KIT (CATHETERS) ×2 IMPLANT
CATH INFINITI 5 FR JR3.5 (CATHETERS) ×2 IMPLANT
DEVICE TORQUE .025-.038 (MISCELLANEOUS) ×2 IMPLANT
NEEDLE ENTRY 21GA 7CM ECHOTIP (NEEDLE) ×2 IMPLANT
PACK ANGIOGRAPHY (CUSTOM PROCEDURE TRAY) ×2 IMPLANT
SET INTRO CAPELLA COAXIAL (SET/KITS/TRAYS/PACK) ×2 IMPLANT
SHEATH BRITE TIP 8FRX11 (SHEATH) ×2 IMPLANT
SYR MEDRAD MARK V 150ML (SYRINGE) ×2 IMPLANT
TUBING CONTRAST HIGH PRESS 72 (TUBING) ×2 IMPLANT
WIRE AQUATRACK .035X260CM (WIRE) ×2 IMPLANT
WIRE J 3MM .035X145CM (WIRE) ×2 IMPLANT

## 2018-10-16 NOTE — Progress Notes (Signed)
Patient ID: Wonda OldsJohn Stephen James, male   DOB: 10/22/1956, 62 y.o.   MRN: [id number]  Patient down in specials recovery getting pulmonary thrombolysis for sub massive PE

## 2018-10-16 NOTE — Op Note (Addendum)
Dwayne James  Percutaneous Study/Intervention Procedural Note   Date of Surgery: 10/16/2018,5:39 PM  Surgeon:James, Latina CraverGregory G   Pre-operative Diagnosis: Pulmonary emboli with right heart strain and hypoxia  Post-operative diagnosis:  Same  Procedure(s) Performed:  1.  Selective injection right subsegmental pulmonary arteries  2.  Mechanical thrombectomy pulmonary emboli using penumbra cat 8 device of both the posterior medial and posterior basal branches of the right lower lobe  3.  Infusion of TPA for thrombolysis      Anesthesia: Conscious sedation was administered under my direct supervision by the interventional radiology RN. IV Versed plus fentanyl were utilized. Continuous ECG, pulse oximetry and blood pressure was monitored throughout the entire procedure.  Conscious sedation was administered for a total of 58 minutes.  Sheath: 8 French 11 cm Pinnacle right common femoral vein antegrade  Contrast: 60 cc   Fluoroscopy Time: 9.4 minutes  Indications:  Patient presented to the hospital with hypoxia and shortness of breath. The patient is unable to get out of bed and transfer to a chair without increased dyspnea. The patient's O2 saturations off nasal cannula are in the 80%. CT angiogram demonstrated bilateral pulmonary emboli associated with significant right heart strain. Given the long-term sequela and the patient's symptomatic condition the risks and benefits for angiography with thrombectomy are reviewed all questions are answered, the patient agrees to proceed.  Procedure:  Dwayne ArthurJohn Stephen Thompsonis a 62 y.o. male who was identified and appropriate procedural time out was performed.  The patient was then placed supine on the table and prepped and draped in the usual sterile fashion.  Ultrasound was used to evaluate the right common femoral vein.  It was compressible indicating it is patent .  A digital ultrasound image was acquired for the permanent  record.  A micropuncture needle was used to access the right common femoral vein under direct ultrasound guidance.  A microwire was then advanced under fluoroscopic guidance followed by micro-sheath.  A 0.035 J wire was advanced without resistance and a 5Fr sheath was placed.    The J-wire pigtail catheter was advanced up to the right atrium where a bolus injection contrast was used to demonstrate the pulmonary artery outflow. Floppy Glidewire was then exchanged for the J-wire and the pigtail catheter was exchanged for an 45 angled pigtail. Using the commendation of the floppy Glidewire and the angled pigtail catheter the pulmonary outflow track was negotiated.  With the catheter in the right side bolus injection contrast was utilized to demonstrate the right pulmonary artery vasculature. This demonstrated occlusion of the right lower lobe pulmonary artery with very poor distal filling of the entire right lower lobe.  The angled pigtail catheter was then advanced out so that it was positioned within the thrombus and 2 doses of 4 milligrams of TPA were infused directly into the clot.  While the TPA was dwelling the penumbra cat 8 catheter was then advanced into the right side and exchanged for the pigtail catheter.  The Penumbra Cat 8 extra torque catheter was then advanced into the thrombus in the right lower lobe and mechanical thrombectomy is carried out through the main right lower lobe pulmonary artery and into the right lateral basal branch. After multiple passes the catheter was then repositioned into the right main pulmonary artery.  Hand-injection of contrast then demonstrated the anatomy of the right lower lobe as well as the middle and upper lobes.  After review these images the penumbra catheter is repositioned into the right  lower lobe into the posterior basal branch, after multiple passes there was free flow of blood and therefore the catheter was removed the pigtail reintroduced and pulmonary  angiography was performed.  Follow-up imaging demonstrated a tremendous improvement in the flow in the right lower lobe with several small areas of clot that was now much more distal in the pulmonary tree and much improvement in the filling of the lobe overall   After review these images the catheter and sheath were removed and pressure held. There were no immediate complications.  Findings:   Right pulmonary artery: The right middle and upper lobe vasculature is widely patent.  Right lower lobe initially demonstrates almost no flow.  Following administration of TPA and subsequent mechanical thrombectomy of the posterior basal and posterior lateral branches of the right lower lobe there is now a tremendous improvement.  There is minimal residual clot in several areas but this is much smaller and much more to the peripheral aspect overall the blood flow to the right lower lobe is very much improved.    Disposition: Patient was taken to the recovery room in stable condition having tolerated the procedure well.  Dwayne James 10/16/2018,5:39 PM

## 2018-10-16 NOTE — Progress Notes (Signed)
ANTICOAGULATION CONSULT NOTE  Pharmacy Consult for heparin Indication: pulmonary embolus  No Known Allergies  Patient Measurements: Height: 5\' 8"  (172.7 cm) Weight: 265 lb (120.2 kg) IBW/kg (Calculated) : 68.4 Heparin Dosing Weight: 95.9 kg  Vital Signs: Temp: 98.3 F (36.8 C) (12/17 0814) Temp Source: Oral (12/17 0814) BP: 131/79 (12/17 1730) Pulse Rate: 49 (12/17 1730)  Labs: Recent Labs    10/16/18 0833 10/16/18 0834  HGB 14.1  --   HCT 42.0  --   PLT 223  --   APTT  --  30  LABPROT  --  13.8  INR  --  1.07  CREATININE 0.77  --   TROPONINI <0.03  --     Estimated Creatinine Clearance: 119.1 mL/min (by C-G formula based on SCr of 0.77 mg/dL).   Medical History: Past Medical History:  Diagnosis Date  . COPD (chronic obstructive pulmonary disease) (HCC)   . GERD (gastroesophageal reflux disease)     Assessment: 62 year old male admitted with respiratory distress. CT shows submassive PE. No anticoagulation PTA.   Goal of Therapy:  Heparin level 0.3-0.7 units/ml Monitor platelets by anticoagulation protocol: Yes   Plan:  Patient underwent thrombectomy and now post procedure.  Per Dr. Gilda CreaseSchnier heparin to be restarted at prior rate with no bolus.  Heparin drip at 1500 units/hr. Will draw heparin level every 6 hours until two consecutive therapeutic levels and then daily and CBC daily per protocol.  Orinda Kennerhris A Tylyn Derwin, PharmD Clinical Pharmacist 10/16/2018 6:03 PM

## 2018-10-16 NOTE — ED Notes (Signed)
Pt ambulatory 6030ft and desated to 88% on RA. EDP notified.

## 2018-10-16 NOTE — Progress Notes (Signed)
Family Meeting Note  Advance Directive no  Today a meeting took place with the pt in ER  Came in with increasing shortness of breath. Workup in the ER showed sub massive PE. Has history of COPD. Discuss code status. Patient wants to be a full code.   Time spent during discussion: 16 mins  Enedina FinnerSona Tabbetha Kutscher, MD

## 2018-10-16 NOTE — H&P (Signed)
[facility] [place] [facility]   PATIENT NAME: Dwayne James    MR#:  [id number]  DATE OF BIRTH:  23-Feb-1956  DATE OF ADMISSION:  10/16/2018  PRIMARY CARE PHYSICIAN: Patient, No Pcp Per   REQUESTING/REFERRING PHYSICIAN: Dr. Sharma Covert  CHIEF COMPLAINT:   Increasing shortness of breath more for last few days started about six months ago HISTORY OF PRESENT ILLNESS:  Dwayne James  is a 62 y.o. male with a known history of COPD, ex-smoker, history of Genella Rife comes to the emergency room with increasing shortness of breath which has been worsened for last few days to the point patient is not able to get around in this house without getting short winded. He said it started around six months ago however did not seek any medical attention.  The ER chest x-ray looked okay and BNP was negative. CT chest was done patient has sub massive bilateral PE  His blood pressure was bit on the higher side. Sats are 97% on 2 L nasal cannula oxygen denies any chest pain, fever, cough, recent travel, any history of cancer  says his brother could have had blood clots however he do not know details about it  He is being started on IV heparin drip. Vascular surgery Dr. Gilda Crease is aware of patient being admitted. Dr. Sharma Covert has informed him.  PAST MEDICAL HISTORY:   Past Medical History:  Diagnosis Date  . COPD (chronic obstructive pulmonary disease) (HCC)   . GERD (gastroesophageal reflux disease)     PAST SURGICAL HISTOIRY:   Past Surgical History:  Procedure Laterality Date  . ANKLE ARTHROSCOPY    . NISSEN FUNDOPLICATION      SOCIAL HISTORY:   Social History   Tobacco Use  . Smoking status: Former Smoker    Last attempt to quit: 05/28/2004    Years since quitting: 14.3  . Smokeless tobacco: Never Used  Substance Use Topics  . Alcohol use: No    FAMILY HISTORY:  No family history on file.  DRUG ALLERGIES:  No Known Allergies  REVIEW OF SYSTEMS:  Review of  Systems  Constitutional: Negative for chills, fever and weight loss.  HENT: Negative for ear discharge, ear pain and nosebleeds.   Eyes: Negative for blurred vision, pain and discharge.  Respiratory: Positive for shortness of breath. Negative for sputum production, wheezing and stridor.   Cardiovascular: Negative for chest pain, palpitations, orthopnea and PND.  Gastrointestinal: Negative for abdominal pain, diarrhea, nausea and vomiting.  Genitourinary: Negative for frequency and urgency.  Musculoskeletal: Negative for back pain and joint pain.  Neurological: Negative for sensory change, speech change, focal weakness and weakness.  Psychiatric/Behavioral: Negative for depression and hallucinations. The patient is not nervous/anxious.      MEDICATIONS AT HOME:   Prior to Admission medications   Medication Sig Start Date End Date Taking? Authorizing Provider  esomeprazole (NEXIUM) 20 MG capsule Take 20 mg by mouth daily as needed.   Yes [provider]  amphetamine-dextroamphetamine (ADDERALL XR) 30 MG 24 hr capsule Take 1 capsule (30 mg total) by mouth every morning as needed only to attempt weaning off. 06/16/17   [provider]  diclofenac (VOLTAREN) 75 MG EC tablet Take 1 tablet (75 mg total) by mouth 2 (two) times daily. Patient not taking: Reported on 10/16/2018 08/04/17   Felecia Shelling, DPM  RABEprazole (ACIPHEX) 20 MG tablet Take by mouth. 05/08/17   [provider]  RABEprazole (ACIPHEX) 20 MG tablet Take by mouth.  05/08/17   [provider]  traMADol (ULTRAM) 50 MG tablet Take 50 mg by mouth every 6 (six) hours as needed.    [provider]      VITAL SIGNS:  Blood pressure (!) 162/90, pulse 60, temperature 98.3 F (36.8 C), temperature source Oral, resp. rate 18, height 5\' 8"  (1.727 m), weight 120.2 kg, SpO2 99 %.  PHYSICAL EXAMINATION:  GENERAL:  62 y.o.-year-old patient lying in the bed with no acute distress. obese EYES: Pupils  equal, round, reactive to light and accommodation. No scleral icterus. Extraocular muscles intact.  HEENT: Head atraumatic, normocephalic. Oropharynx and nasopharynx clear.  NECK:  Supple, no jugular venous distention. No thyroid enlargement, no tenderness.  LUNGS: Normal breath sounds bilaterally, no wheezing, rales,rhonchi or crepitation. No use of accessory muscles of respiration.  CARDIOVASCULAR: S1, S2 normal. No murmurs, rubs, or gallops.  ABDOMEN: Soft, nontender, nondistended. Bowel sounds present. No organomegaly or mass.  EXTREMITIES: No pedal edema, cyanosis, or clubbing.  NEUROLOGIC: Cranial nerves II through XII are intact. Muscle strength 5/5 in all extremities. Sensation intact. Gait not checked.  PSYCHIATRIC: The patient is alert and oriented x 3.  SKIN: No obvious rash, lesion, or ulcer.   LABORATORY PANEL:   CBC Recent Labs  Lab 10/16/18 0833  WBC 7.1  HGB 14.1  HCT 42.0  PLT 223   ------------------------------------------------------------------------------------------------------------------  Chemistries  Recent Labs  Lab 10/16/18 0833  NA 137  K 4.1  CL 107  CO2 23  GLUCOSE 126*  BUN 17  CREATININE 0.77  CALCIUM 8.3*  AST 24  ALT 18  ALKPHOS 38  BILITOT 0.8   ------------------------------------------------------------------------------------------------------------------  Cardiac Enzymes Recent Labs  Lab 10/16/18 0833  TROPONINI <0.03   ------------------------------------------------------------------------------------------------------------------  RADIOLOGY:  Dg Chest 2 View  Result Date: 10/16/2018 CLINICAL DATA:  Shortness of breath EXAM: CHEST - 2 VIEW COMPARISON:  07/07/2005 FINDINGS: Heart and mediastinal contours are within normal limits. No focal opacities or effusions. No acute bony abnormality. IMPRESSION: No active cardiopulmonary disease. Electronically Signed   By: Charlett NoseKevin  Dover M.D.   On: 10/16/2018 09:02   Ct Angio Chest  Pe W And/or Wo Contrast  Result Date: 10/16/2018 CLINICAL DATA:  62 year old male with progressive shortness of breath. Acutely hypoxic. Chronic lung disease. Hypertensive. EXAM: CT ANGIOGRAPHY CHEST WITH CONTRAST TECHNIQUE: Multidetector CT imaging of the chest was performed using the standard protocol during bolus administration of intravenous contrast. Multiplanar CT image reconstructions and MIPs were obtained to evaluate the vascular anatomy. CONTRAST:  75mL ISOVUE-370 IOPAMIDOL (ISOVUE-370) INJECTION 76% COMPARISON:  Chest radiographs 0854 hours today and earlier. CT Abdomen and Pelvis 05/29/2016. FINDINGS: Cardiovascular: Suboptimal contrast bolus timing in the pulmonary arterial tree. However, bilateral pulmonary embolus is evident. Left upper lobe thrombus is most apparent on series 5, image 113. No central or saddle embolus. Right lower lobar and segmental thrombus as seen on series 5, image 142 and series 10, image 44. Left lower lobe basal segmental thrombus (series 5, image 193). Right middle lobe segmental thrombus on image 155. Right upper lobe segmental thrombus on image 92. RV / LV ratio = > 1, with straightening of the interventricular septum. No pericardial effusion. Mild cardiomegaly. No calcified coronary artery atherosclerosis is evident. There is mild Calcified aortic atherosclerosis. Mediastinum/Nodes: No mediastinal lymphadenopathy. Lungs/Pleura: Major airways are patent. Mild paraseptal emphysema in the right upper lobe. No pleural effusion or confluent pulmonary opacity. Upper Abdomen: Negative visible liver, gallbladder, spleen, pancreas, adrenal glands, left kidney, and bowel in the  upper abdomen. Musculoskeletal: Negative. Review of the MIP images confirms the above findings. IMPRESSION: 1. Positive for Acute Pulmonary Embolus. No central or saddle embolus but there is multifocal bilateral PE with CT evidence of right heart strain (RV/LV Ratio > 1) consistent with at least submassive  (intermediate risk) PE. The presence of right heart strain has been associated with an increased risk of morbidity and mortality. Please activate Code PE by paging [phone]. 2. Both lungs are clear. Mild cardiomegaly, no pericardial or pleural effusion. Critical Value/emergent results were called by telephone at the time of interpretation on 10/16/2018 at 10:14 am to Dr. Rockne Menghini , who verbally acknowledged these results. Electronically Signed   By: Odessa Fleming M.D.   On: 10/16/2018 10:13    EKG:    IMPRESSION AND PLAN:   Dwayne James  is a 62 y.o. male with a known history of COPD, ex-smoker, history of Genella Rife comes to the emergency room with increasing shortness of breath which has been worsened for last few days to the point patient is not able to get around in this house without getting short winded. He said it started around six months ago however did not seek any medical attention. The ER chest x-ray looked okay and BNP was negative. CT chest was done patient has sub massive bilateral PE.  1.Submassive PE ,unclear etiology -admit patient to telemetry -case discussed with pulmonary Dr. Belia Heman in case patient needs to go to ICU. Currently he is hemodynamically stable. -IV heparin drip -hyper coag workup sent -consider oncology consultation if needed -vascular consultation with Dr. Gilda Crease -ultrasound Doppler bilateral lower extremity  2. Elevated blood pressure without diagnosis of hypertension -will start patient on beta-blockers -PRN hydralazine  3. COPD with history of smoking in the past -PRN inhalers nebulizer  4. DVT prophylaxis already on heparin drip  No family in the ER       All the records are reviewed and case discussed with ED provider. Management plans discussed with the patient, family and they are in agreement.  CODE STATUS: full  TOTAL TIME TAKING CARE OF THIS PATIENT: *50* minutes.    Enedina Finner M.D on 10/16/2018 at 12:32 PM  Between 7am to  6pm - Pager - [phone]  After 6pm go to [email or web address] - password [email or web address] [facility]  [facility]  Office  [phone]  CC: Primary care physician; Patient, No Pcp Per

## 2018-10-16 NOTE — Progress Notes (Signed)
[facility] Daily Progress Note   Vascular Communication Note  Will plan on pulmonary lysis for pulmonary embolus with Dr. Gilda CreaseSchnier today. Will pre-op  Will consult to follow.  Cleda DaubKimberly Asia Favata PA-C 10/16/2018 1:23 PM

## 2018-10-16 NOTE — ED Triage Notes (Signed)
Pt reports his breathing has been progressively getting worse and has had weight gain. Pt denies heart problems, reports used to smoke and has had COPD but he quit and has not had trouble since then. Denies pain.

## 2018-10-16 NOTE — ED Notes (Signed)
Pt c/o increased SOB over the past 2 weeks, states SOB with exertion or laying down at night., states he has gained wt recently. Pt has noted 1+ pitting edema worse in the left then the right LE. Pt is in NAD at rest but with minimal ambulation pt becomes distressed and tachypneic.

## 2018-10-16 NOTE — Consult Note (Signed)
[facility] Vascular Consult Note  MRN : [id number]  Dwayne James is a 62 y.o. (05-22-1956) male who presents with chief complaint of  Chief Complaint  Patient presents with  . Respiratory Distress   History of Present Illness:  The patient is a 62 year old male with a past medical history of depression, GERD, hypercholesterolemia, impaired glucose tolerance, chronic opioid use with broken pain management contract, COPD former tobacco abuse quit May 28, 2004, who presented to the [facility] emergency department with progressively worsening shortness of breath.  The patient endorses a history of progressively worsening shortness of breath for approximately 6 months.  He notes that over the "last few days" his dyspnea has worsened to the point that he is unable to ambulate which prompted him to seek medical attention.  Patient denies any recent surgery or trauma, prolonged illness, or known genetic clotting/bleeding disorders.  Patient denies any lower extremity pain or swelling.  Patient notes some intermittent chest pain with his shortness of breath.  Patient denies any productive cough.  Patient denies any fever, nausea vomiting.  Patient denies any past history of DVT or pulmonary embolism.  CTA Chest: 10/16/18: Positive for Acute Pulmonary Embolus. No central or saddle embolus but there is multifocal bilateral PE with CT evidence of right heart strain (RV/LV Ratio > 1) consistent with at least submassive (intermediate risk) PE. The presence of right heart strain has been associated with an increased risk of morbidity and mortality. 2. Both lungs are clear. Mild cardiomegaly, no pericardial or pleural effusion.  Venous Duplex: 10/16/18: No evidence of DVT within either lower extremity.  Vascular surgery was consulted by Dr. Sharma Covert for possible pulmonary thrombolysis.  Current Facility-Administered Medications  Medication Dose Route  Frequency Provider Last Rate Last Dose  . 0.9 %  sodium chloride infusion   Intravenous Continuous Kaladin Noseworthy A, PA-C      . ceFAZolin (ANCEF) IVPB 2g/100 mL premix  2 g Intravenous Once Lunna Vogelgesang A, PA-C      . famotidine (PEPCID) tablet 40 mg  40 mg Oral PRN Philip Eckersley A, PA-C      . heparin ADULT infusion 100 units/mL (25000 units/250mL sodium chloride 0.45%)  1,500 Units/hr Intravenous Continuous Pricilla Riffle, RPH 15 mL/hr at 10/16/18 1144 1,500 Units/hr at 10/16/18 1144  . HYDROmorphone (DILAUDID) injection 1 mg  1 mg Intravenous Once PRN Shalika Arntz A, PA-C      . methylPREDNISolone sodium succinate (SOLU-MEDROL) 125 mg/2 mL injection 125 mg  125 mg Intravenous PRN Clark Cuff A, PA-C      . ondansetron (ZOFRAN) injection 4 mg  4 mg Intravenous Q6H PRN Travelle Mcclimans, Ranae Plumber, PA-C       Past Medical History:  Diagnosis Date  . COPD (chronic obstructive pulmonary disease) (HCC)   . GERD (gastroesophageal reflux disease)    Past Surgical History:  Procedure Laterality Date  . ANKLE ARTHROSCOPY    . NISSEN FUNDOPLICATION     Social History Social History   Tobacco Use  . Smoking status: Former Smoker    Last attempt to quit: 05/28/2004    Years since quitting: 14.3  . Smokeless tobacco: Never Used  Substance Use Topics  . Alcohol use: No  . Drug use: No   Family History No family history on file.  Denies a family history of peripheral artery disease, venous disease or bleeding/clotting disorders.  No Known Allergies  REVIEW OF SYSTEMS (Negative unless checked)  Constitutional: [] Weight  loss  [] Fever  [] Chills Cardiac: [x] Chest pain   [x] Chest pressure   [x] Palpitations   [x] Shortness of breath when laying flat   [x] Shortness of breath at rest   [x] Shortness of breath with exertion. Vascular:  [] Pain in legs with walking   [] Pain in legs at rest   [] Pain in legs when laying flat   [] Claudication   [] Pain in feet when walking   [] Pain in feet at rest  [] Pain in feet when laying flat   [] History of DVT   [] Phlebitis   [] Swelling in legs   [] Varicose veins   [] Non-healing ulcers Pulmonary:   [] Uses home oxygen   [] Productive cough   [] Hemoptysis   [] Wheeze  [x] COPD   [] Asthma Neurologic:  [] Dizziness  [] Blackouts   [] Seizures   [] History of stroke   [] History of TIA  [] Aphasia   [] Temporary blindness   [] Dysphagia   [] Weakness or numbness in arms   [] Weakness or numbness in legs Musculoskeletal:  [] Arthritis   [] Joint swelling   [] Joint pain   [] Low back pain Hematologic:  [] Easy bruising  [] Easy bleeding   [] Hypercoagulable state   [] Anemic  [] Hepatitis Gastrointestinal:  [] Blood in stool   [] Vomiting blood  [] Gastroesophageal reflux/heartburn   [] Difficulty swallowing. Genitourinary:  [] Chronic kidney disease   [] Difficult urination  [] Frequent urination  [] Burning with urination   [] Blood in urine Skin:  [] Rashes   [] Ulcers   [] Wounds Psychological:  [] History of anxiety   [x]  History of major depression.  Physical Examination  Vitals:   10/16/18 1130 10/16/18 1200 10/16/18 1230 10/16/18 1353  BP: (!) 128/99 (!) 157/90 134/81 (!) 155/105  Pulse: 60 64 (!) 52 64  Resp: 14 17 11 18   Temp:      TempSrc:      SpO2: 97% 95% 95% 97%  Weight:      Height:       Body mass index is 40.29 kg/m. Gen:  WD/WN, NAD, On nasal cannula. Head: [place]/AT, No temporalis wasting. Prominent temp pulse not noted. Ear/Nose/Throat: Hearing grossly intact, nares w/o erythema or drainage, oropharynx w/o Erythema/Exudate Eyes: Sclera non-icteric, conjunctiva clear Neck: Trachea midline.  No JVD.  Pulmonary:  Good air movement, respirations not labored, equal bilaterally.  Cardiac: RRR, normal S1, S2. Vascular:  Vessel Right Left  Radial Palpable Palpable  Ulnar Palpable Palpable  Brachial Palpable Palpable  Carotid Palpable, without bruit Palpable, without bruit  Aorta Not palpable N/A  Femoral Palpable Palpable  Popliteal  Palpable Palpable  PT Palpable Palpable  DP Palpable Palpable   Gastrointestinal: soft, non-tender/non-distended. No guarding/reflex.  Musculoskeletal: M/S 5/5 throughout.  Extremities without ischemic changes.  No deformity or atrophy. No edema. Neurologic: Sensation grossly intact in extremities.  Symmetrical.  Speech is fluent. Motor exam as listed above. Psychiatric: Judgment intact, Mood & affect appropriate for pt's clinical situation. Dermatologic: No rashes or ulcers noted.  No cellulitis or open wounds. Lymph : No Cervical, Axillary, or Inguinal lymphadenopathy.  CBC Lab Results  Component Value Date   WBC 7.1 10/16/2018   HGB 14.1 10/16/2018   HCT 42.0 10/16/2018   MCV 92.5 10/16/2018   PLT 223 10/16/2018   BMET    Component Value Date/Time   NA 137 10/16/2018 0833   K 4.1 10/16/2018 0833   CL 107 10/16/2018 0833   CO2 23 10/16/2018 0833   GLUCOSE 126 (H) 10/16/2018 0833   BUN 17 10/16/2018 0833   CREATININE 0.77 10/16/2018 0833   CALCIUM 8.3 (L) 10/16/2018 [id number]  GFRNONAA >60 10/16/2018 0833   GFRAA >60 10/16/2018 0833   Estimated Creatinine Clearance: 119.1 mL/min (by C-G formula based on SCr of 0.77 mg/dL).  COAG Lab Results  Component Value Date   INR 1.07 10/16/2018   Radiology Dg Chest 2 View  Result Date: 10/16/2018 CLINICAL DATA:  Shortness of breath EXAM: CHEST - 2 VIEW COMPARISON:  07/07/2005 FINDINGS: Heart and mediastinal contours are within normal limits. No focal opacities or effusions. No acute bony abnormality. IMPRESSION: No active cardiopulmonary disease. Electronically Signed   By: Charlett Nose M.D.   On: 10/16/2018 09:02   Ct Angio Chest Pe W And/or Wo Contrast  Result Date: 10/16/2018 CLINICAL DATA:  62 year old male with progressive shortness of breath. Acutely hypoxic. Chronic lung disease. Hypertensive. EXAM: CT ANGIOGRAPHY CHEST WITH CONTRAST TECHNIQUE: Multidetector CT imaging of the chest was performed using the standard protocol  during bolus administration of intravenous contrast. Multiplanar CT image reconstructions and MIPs were obtained to evaluate the vascular anatomy. CONTRAST:  75mL ISOVUE-370 IOPAMIDOL (ISOVUE-370) INJECTION 76% COMPARISON:  Chest radiographs 0854 hours today and earlier. CT Abdomen and Pelvis 05/29/2016. FINDINGS: Cardiovascular: Suboptimal contrast bolus timing in the pulmonary arterial tree. However, bilateral pulmonary embolus is evident. Left upper lobe thrombus is most apparent on series 5, image 113. No central or saddle embolus. Right lower lobar and segmental thrombus as seen on series 5, image 142 and series 10, image 44. Left lower lobe basal segmental thrombus (series 5, image 193). Right middle lobe segmental thrombus on image 155. Right upper lobe segmental thrombus on image 92. RV / LV ratio = > 1, with straightening of the interventricular septum. No pericardial effusion. Mild cardiomegaly. No calcified coronary artery atherosclerosis is evident. There is mild Calcified aortic atherosclerosis. Mediastinum/Nodes: No mediastinal lymphadenopathy. Lungs/Pleura: Major airways are patent. Mild paraseptal emphysema in the right upper lobe. No pleural effusion or confluent pulmonary opacity. Upper Abdomen: Negative visible liver, gallbladder, spleen, pancreas, adrenal glands, left kidney, and bowel in the upper abdomen. Musculoskeletal: Negative. Review of the MIP images confirms the above findings. IMPRESSION: 1. Positive for Acute Pulmonary Embolus. No central or saddle embolus but there is multifocal bilateral PE with CT evidence of right heart strain (RV/LV Ratio > 1) consistent with at least submassive (intermediate risk) PE. The presence of right heart strain has been associated with an increased risk of morbidity and mortality. Please activate Code PE by paging [phone]. 2. Both lungs are clear. Mild cardiomegaly, no pericardial or pleural effusion. Critical Value/emergent results were called by  telephone at the time of interpretation on 10/16/2018 at 10:14 am to Dr. Rockne Menghini , who verbally acknowledged these results. Electronically Signed   By: Odessa Fleming M.D.   On: 10/16/2018 10:13   US Venous Img Lower Bilateral  Result Date: 10/16/2018 CLINICAL DATA:  History of pulmonary embolism, now with bilateral lower extremity pain. Shortness of breath. History of smoking. Evaluate for DVT. EXAM: BILATERAL LOWER EXTREMITY VENOUS DOPPLER ULTRASOUND TECHNIQUE: Gray-scale sonography with graded compression, as well as color Doppler and duplex ultrasound were performed to evaluate the lower extremity deep venous systems from the level of the common femoral vein and including the common femoral, femoral, profunda femoral, popliteal and calf veins including the posterior tibial, peroneal and gastrocnemius veins when visible. The superficial great saphenous vein was also interrogated. Spectral Doppler was utilized to evaluate flow at rest and with distal augmentation maneuvers in the common femoral, femoral and popliteal veins. COMPARISON:  None. FINDINGS: RIGHT LOWER EXTREMITY  Common Femoral Vein: No evidence of thrombus. Normal compressibility, respiratory phasicity and response to augmentation. Saphenofemoral Junction: No evidence of thrombus. Normal compressibility and flow on color Doppler imaging. Profunda Femoral Vein: No evidence of thrombus. Normal compressibility and flow on color Doppler imaging. Femoral Vein: No evidence of thrombus. Normal compressibility, respiratory phasicity and response to augmentation. Popliteal Vein: No evidence of thrombus. Normal compressibility, respiratory phasicity and response to augmentation. Calf Veins: No evidence of thrombus. Normal compressibility and flow on color Doppler imaging. Superficial Great Saphenous Vein: No evidence of thrombus. Normal compressibility. Venous Reflux:  None. Other Findings:  None. LEFT LOWER EXTREMITY Common Femoral Vein: No evidence  of thrombus. Normal compressibility, respiratory phasicity and response to augmentation. Saphenofemoral Junction: No evidence of thrombus. Normal compressibility and flow on color Doppler imaging. Profunda Femoral Vein: No evidence of thrombus. Normal compressibility and flow on color Doppler imaging. Femoral Vein: No evidence of thrombus. Normal compressibility, respiratory phasicity and response to augmentation. Popliteal Vein: No evidence of thrombus. Normal compressibility, respiratory phasicity and response to augmentation. Calf Veins: No evidence of thrombus. Normal compressibility and flow on color Doppler imaging. Superficial Great Saphenous Vein: No evidence of thrombus. Normal compressibility. Venous Reflux:  None. Other Findings:  None. IMPRESSION: No evidence of DVT within either lower extremity. Electronically Signed   By: Simonne Come M.D.   On: 10/16/2018 13:50   Assessment/Plan The patient is a 62 year old male with a past medical history of depression, GERD, hypercholesterolemia, impaired glucose tolerance, chronic opioid use with broken pain management contract, COPD former tobacco abuse quit May 28, 2004, who presented to the [facility] emergency department with progressively worsening shortness of breath.  Found to have a sub-massive multifocal bilateral pulmonary embolism 1. Sub-massive multifocal bilateral pulmonary embolism: Patient with progressively worsening shortness of breath over the last 6 months.  Patient notes his dyspnea has worsened to the point that he is unable to ambulate this is what prompted him to seek medical attention in our ED.  Patient found to have a submassive multifocal bilateral pulmonary embolism with right heart strain on CTA.  Due to the patient's progressively worsening dyspnea now to the point of being unable to ambulate, low oxygen saturation off of supplemental oxygen, right heart strain recommend undergoing a pulmonary thrombectomy  and attempt to assess the patient's anatomy and lessen the clot burden allowing improved oxygenation / less stress on the heart.  Spoke with the patient and his brother who was on his cell phone and discussed the procedure, risks and benefits.  All questions were answered.  The patient and his brother who is his healthcare proxy have agreed to proceed.  2.  Hypercholesteremia: Encouraged good control as its slows the progression of atherosclerotic disease 3.  Paired glucose tolerance: Encouraged good control as its slows the progression of atherosclerotic disease  Discussed with Dr. Romie Jumper, PA-C  10/16/2018 2:13 PM  This note was created with Dragon medical transcription system.  Any error is purely unintentional.

## 2018-10-16 NOTE — Progress Notes (Signed)
Patient does not seem to understand.  When he could elevate his head he sat up quickly using his abdominal muscles.  I reinforced that he should be more careful.  He said it's just a small wound.  I explained it was a small wound in a very large vessel. Instructed him to splint his groin area when he moves in the bed.

## 2018-10-16 NOTE — Progress Notes (Addendum)
ANTICOAGULATION CONSULT NOTE  Pharmacy Consult for heparin Indication: pulmonary embolus  No Known Allergies  Patient Measurements: Height: 5\' 8"  (172.7 cm) Weight: 265 lb (120.2 kg) IBW/kg (Calculated) : 68.4 Heparin Dosing Weight: 95.9 kg  Vital Signs: Temp: 98.3 F (36.8 C) (12/17 0814) Temp Source: Oral (12/17 0814) BP: 162/90 (12/17 1030) Pulse Rate: 60 (12/17 1045)  Labs: Recent Labs    10/16/18 0833  HGB 14.1  HCT 42.0  PLT 223  CREATININE 0.77  TROPONINI <0.03    Estimated Creatinine Clearance: 119.1 mL/min (by C-G formula based on SCr of 0.77 mg/dL).   Medical History: Past Medical History:  Diagnosis Date  . COPD (chronic obstructive pulmonary disease) (HCC)   . GERD (gastroesophageal reflux disease)     Assessment: 62 year old male admitted with respiratory distress. CT shows submassive PE. No anticoagulation PTA.   Goal of Therapy:  Heparin level 0.3-0.7 units/ml Monitor platelets by anticoagulation protocol: Yes   Plan:  Discussed with Dr. Sharma CovertNorman and patient does not appear at increased risk of bleeding. Will proceed with heparin dosing according to PE protocol. Confirmed with admitting MD that we are not going to do tPA.  Will give heparin 4800 unit bolus followed by heparin drip at 1500 units/hr. Heparin level ordered for 1800. CBC with morning labs.  Pricilla RiffleAbby K Rayn Enderson, PharmD Pharmacy Resident  10/16/2018 11:09 AM

## 2018-10-16 NOTE — ED Notes (Signed)
First Nurse Note: Patient to ED with non-rebreather mask on and 12L.  Pulse ox 100%.  Neighbor is an EMT and he placed patient on O2.  Alert and oriented.

## 2018-10-16 NOTE — ED Notes (Signed)
Pt returned from CT, states he had a period of nausea after the contrast was infused but states he is feeling better now. Will continue to monitor the pt.

## 2018-10-16 NOTE — ED Notes (Signed)
Patient transported to CT 

## 2018-10-16 NOTE — ED Provider Notes (Addendum)
[facility] Emergency Department Provider Note  ____________________________________________  Time seen: Approximately 8:31 AM  I have reviewed the triage vital signs and the nursing notes.   HISTORY  Chief Complaint Respiratory Distress    HPI Dwayne James is a 62 y.o. male with a history of COPD not on oxygen, HL, brought by his EMS neighbor for hypoxia.  The patient reports that for the past 6 months he has had a progressively worsening shortness of breath with exertional dyspnea, orthopnea, and decreased exercise tolerance.  He states "I can even walk 10 feet without getting significantly short of breath."  He states that overnight, he became more short of breath, and hypertensive to the 170s over 110s.  He called his EMS neighbor, who found him to have pulse oximetry of 81% on room air, which improved to low 90s on 4 L nasal cannula.  The patient denies any recent cough or cold symptoms, fever or chills, chest pain, tightness or pressure.  He has noted new lower extremity swelling with weight gain.  Patient has not seen a physician for several years.  Past Medical History:  Diagnosis Date  . COPD (chronic obstructive pulmonary disease) (HCC)   . GERD (gastroesophageal reflux disease)     Patient Active Problem List   Diagnosis Date Noted  . Pain management contract broken 06/15/2017  . Impaired glucose tolerance 06/13/2017  . Chronic bilateral low back pain without sciatica 06/08/2017  . Chronic, continuous use of opioids 06/08/2017  . Plantar fasciitis 06/08/2017  . Poor motivation 10/02/2015  . Internal derangement of knee joint, right 10/23/2014  . Hypercholesterolemia 09/29/2014  . Depression 09/23/2014  . GERD (gastroesophageal reflux disease) 09/23/2014    Past Surgical History:  Procedure Laterality Date  . ANKLE ARTHROSCOPY    . NISSEN FUNDOPLICATION      Current Outpatient Rx  . Order #: [id number] Class: Historical Med  .  Order #: [id number] Class: Historical Med  . Order #: [id number] Class: Normal  . Order #: [id number] Class: Historical Med  . Order #: [id number] Class: Historical Med  . Order #: [id number] Class: Historical Med    Allergies Patient has no known allergies.  No family history on file.  Social History Social History   Tobacco Use  . Smoking status: Former Smoker    Last attempt to quit: 05/28/2004    Years since quitting: 14.3  . Smokeless tobacco: Never Used  Substance Use Topics  . Alcohol use: No  . Drug use: No    Review of Systems Constitutional: No fever/chills.  No lightheadedness or syncope.  No diaphoresis.  Positive weight gain. Eyes: No visual changes. ENT: No sore throat. No congestion or rhinorrhea. Cardiovascular: Denies chest pain. Denies palpitations. Respiratory: Positive exertional shortness of breath with orthopnea and decreased exercise tolerance.  No cough. Gastrointestinal: No abdominal pain.  No nausea, no vomiting.  No diarrhea.  No constipation. Genitourinary: Negative for dysuria. Musculoskeletal: Negative for back pain.  Positive bilateral lower extremity swelling. Skin: Negative for rash. Neurological: Negative for headaches. No focal numbness, tingling or weakness.     ____________________________________________   PHYSICAL EXAM:  VITAL SIGNS: ED Triage Vitals  Enc Vitals Group     BP 10/16/18 0809 (!) 157/75     Pulse Rate 10/16/18 0809 77     Resp 10/16/18 0809 (!) 22     Temp 10/16/18 0814 98.3 F (36.8 C)     Temp Source 10/16/18 0814 Oral     SpO2 10/16/18 0809 100 %  Weight 10/16/18 0805 265 lb (120.2 kg)     Height 10/16/18 0805 5\' 8"  (1.727 m)     Head Circumference --      Peak Flow --      Pain Score 10/16/18 0805 0     Pain Loc --      Pain Edu? --      Excl. in GC? --     Constitutional: Alert and oriented.  Answers questions appropriately. Eyes: Conjunctivae are normal.  EOMI. No scleral icterus. Head:  Atraumatic. Nose: No congestion/rhinnorhea. Mouth/Throat: Mucous membranes are moist.  Neck: No stridor.  Supple.  Positive JVD.  No meningismus. Cardiovascular: Normal rate, regular rhythm. No murmurs, rubs or gallops.  Respiratory: The patient has a prolonged expiratory phase without tachypnea, accessory muscle use or retractions.  There are no wheezes rales or rhonchi on my exam. Gastrointestinal: Obese.  Soft, nontender and nondistended.  No guarding or rebound.  No peritoneal signs. Musculoskeletal: Positive bilateral symmetric pitting LE edema to the mid tibial shaft. No ttp in the calves or palpable cords.  Negative Homan's sign. Neurologic:  A&Ox3.  Speech is clear.  Face and smile are symmetric.  EOMI.  Moves all extremities well. Skin:  Skin is warm, dry and intact. No rash noted. Psychiatric: Mood and affect are normal.   ____________________________________________   LABS (all labs ordered are listed, but only abnormal results are displayed)  Labs Reviewed  COMPREHENSIVE METABOLIC PANEL - Abnormal; Notable for the following components:      Result Value   Glucose, Bld 126 (*)    Calcium 8.3 (*)    All other components within normal limits  BLOOD GAS, VENOUS - Abnormal; Notable for the following components:   pH, Ven 7.46 (*)    pCO2, Ven 36 (*)    pO2, Ven 84.0 (*)    All other components within normal limits  CBC  TROPONIN I  BRAIN NATRIURETIC PEPTIDE   ____________________________________________  EKG  ED ECG REPORT I, Anne-Caroline Sharma Covert, the attending physician, personally viewed and interpreted this ECG.   Date: 10/16/2018  EKG Time: 816  Rate: 84  Rhythm: normal sinus rhythm  Axis: leftward  Intervals:none  ST&T Change: No STEMI  ____________________________________________  RADIOLOGY  Dg Chest 2 View  Result Date: 10/16/2018 CLINICAL DATA:  Shortness of breath EXAM: CHEST - 2 VIEW COMPARISON:  07/07/2005 FINDINGS: Heart and mediastinal  contours are within normal limits. No focal opacities or effusions. No acute bony abnormality. IMPRESSION: No active cardiopulmonary disease. Electronically Signed   By: Charlett Nose M.D.   On: 10/16/2018 09:02   Ct Angio Chest Pe W And/or Wo Contrast  Result Date: 10/16/2018 CLINICAL DATA:  62 year old male with progressive shortness of breath. Acutely hypoxic. Chronic lung disease. Hypertensive. EXAM: CT ANGIOGRAPHY CHEST WITH CONTRAST TECHNIQUE: Multidetector CT imaging of the chest was performed using the standard protocol during bolus administration of intravenous contrast. Multiplanar CT image reconstructions and MIPs were obtained to evaluate the vascular anatomy. CONTRAST:  75mL ISOVUE-370 IOPAMIDOL (ISOVUE-370) INJECTION 76% COMPARISON:  Chest radiographs 0854 hours today and earlier. CT Abdomen and Pelvis 05/29/2016. FINDINGS: Cardiovascular: Suboptimal contrast bolus timing in the pulmonary arterial tree. However, bilateral pulmonary embolus is evident. Left upper lobe thrombus is most apparent on series 5, image 113. No central or saddle embolus. Right lower lobar and segmental thrombus as seen on series 5, image 142 and series 10, image 44. Left lower lobe basal segmental thrombus (series 5, image 193). Right middle lobe  segmental thrombus on image 155. Right upper lobe segmental thrombus on image 92. RV / LV ratio = > 1, with straightening of the interventricular septum. No pericardial effusion. Mild cardiomegaly. No calcified coronary artery atherosclerosis is evident. There is mild Calcified aortic atherosclerosis. Mediastinum/Nodes: No mediastinal lymphadenopathy. Lungs/Pleura: Major airways are patent. Mild paraseptal emphysema in the right upper lobe. No pleural effusion or confluent pulmonary opacity. Upper Abdomen: Negative visible liver, gallbladder, spleen, pancreas, adrenal glands, left kidney, and bowel in the upper abdomen. Musculoskeletal: Negative. Review of the MIP images confirms  the above findings. IMPRESSION: 1. Positive for Acute Pulmonary Embolus. No central or saddle embolus but there is multifocal bilateral PE with CT evidence of right heart strain (RV/LV Ratio > 1) consistent with at least submassive (intermediate risk) PE. The presence of right heart strain has been associated with an increased risk of morbidity and mortality. Please activate Code PE by paging [phone]. 2. Both lungs are clear. Mild cardiomegaly, no pericardial or pleural effusion. Critical Value/emergent results were called by telephone at the time of interpretation on 10/16/2018 at 10:14 am to Dr. Rockne Menghini , who verbally acknowledged these results. Electronically Signed   By: Odessa Fleming M.D.   On: 10/16/2018 10:13    ____________________________________________   PROCEDURES  Procedure(s) performed: None  Procedures  Critical Care performed: Yes, see critical care note(s) ____________________________________________   INITIAL IMPRESSION / ASSESSMENT AND PLAN / ED COURSE  Pertinent labs & imaging results that were available during my care of the patient were reviewed by me and considered in my medical decision making (see chart for details).  62 y.o. male with history of COPD presenting with 6 months of progressively worsening exertional shortness of breath, orthopnea, and lower extremity swelling, now hypoxic to the low 90s on room air.  Overall, the patient is chronically ill-appearing and I am concerned about this new hypoxia.  Worsening underlying COPD with possible new exacerbation, or new onset CHF is possible given his lower extremity swelling.  PE is considered as well.  The patient will remain on oxygen while he is here, and undergo basic laboratory testing including BNP, blood gas and troponin.  A chest x-ray has been performed and shows no acute cardiopulmonary abnormality so CT scan has been added.  ----------------------------------------- 9:27 AM on  10/16/2018 ----------------------------------------- The patient's blood gas shows a pH of 7.46 with a PCO2 of 36 and a PO2 which is reassuring at 84.  His troponin is less than 0.03.  We ambulated the patient with significant exertional dyspnea, he became very symptomatic; his O2 sats did decrease to 88% on room air.  I am awaiting the results of his CT scan and BNP.  ----------------------------------------- 10:29 AM on 10/16/2018 -----------------------------------------  The patient's EKG did not show any evidence of arrhythmia or ischemia and his troponin was negative.  In addition, history of BNP is normal.  His chest x-ray did not show any acute process, so I ordered a CT for PE evaluation as I did not feel he had a answer for the patient's hypoxia.  His results do show submassive PE with clot burden in all the lobes.  I have consulted vascular for possible intra-arterial lysis, and Dr. Gilda Crease will call me back as soon as he is out of the operating room.  Heparin has been ordered.  I have spoken to the patient about his results and admitted him to the hospitalists for continued evaluation and treatment.  ----------------------------------------- 11:00 AM on 10/16/2018 -----------------------------------------  I spoken with Dr. Gilda CreaseSchnier, the vascular surgeon on-call, who does recommend intra-arterial lysis for the patient's PE today.   CRITICAL CARE Performed by: Rockne MenghiniAnne-Caroline Chrisie Jankovich   Total critical care time: 35 minutes  Critical care time was exclusive of separately billable procedures and treating other patients.  Critical care was necessary to treat or prevent imminent or life-threatening deterioration.  Critical care was time spent personally by me on the following activities: development of treatment plan with patient and/or surrogate as well as nursing, discussions with consultants, evaluation of patient's response to treatment, examination of patient, obtaining history from  patient or surrogate, ordering and performing treatments and interventions, ordering and review of laboratory studies, ordering and review of radiographic studies, pulse oximetry and re-evaluation of patient's condition.   ____________________________________________  FINAL CLINICAL IMPRESSION(S) / ED DIAGNOSES  Final diagnoses:  Hypoxia  Acute pulmonary embolism, unspecified pulmonary embolism type, unspecified whether acute cor pulmonale present (HCC)         NEW MEDICATIONS STARTED DURING THIS VISIT:  New Prescriptions   No medications on file      Rockne MenghiniNorman, Anne-Caroline, MD 10/16/18 [id number]    Rockne MenghiniNorman, Anne-Caroline, MD 10/16/18 1031    Rockne MenghiniNorman, Anne-Caroline, MD 10/16/18 1101

## 2018-10-16 NOTE — ED Notes (Signed)
Called pharmacy for heparin drip.

## 2018-10-16 NOTE — ED Notes (Signed)
Patient transported to X-ray 

## 2018-10-16 NOTE — ED Triage Notes (Signed)
Pt presents with neighbor who is a EMT on oxygen. Per neighbor pts sats were 81% on RA and decreased when he walked. Pt is currently on 6L of oxygen at 100%. Pt started on 12L to get his breathing down.

## 2018-10-17 ENCOUNTER — Encounter: Payer: Self-pay | Admitting: Vascular Surgery

## 2018-10-17 LAB — BETA-2-GLYCOPROTEIN I ABS, IGG/M/A
Beta-2 Glyco I IgG: <9 GPI IgG units (ref 0–20)
Beta-2-Glycoprotein I IgM: <9 GPI IgM units (ref 0–32)

## 2018-10-17 LAB — CBC
HCT: 40 % (ref 39.0–52.0)
Hemoglobin: 13 g/dL (ref 13.0–17.0)
MCH: 31 pg (ref 26.0–34.0)
MCHC: 32.5 g/dL (ref 30.0–36.0)
MCV: 95.5 fL (ref 80.0–100.0)
PLATELETS: 200 10*3/uL (ref 150–400)
RBC: 4.19 MIL/uL — ABNORMAL LOW (ref 4.22–5.81)
RDW: 11.9 % (ref 11.5–15.5)
WBC: 7.5 10*3/uL (ref 4.0–10.5)
nRBC: 0 % (ref 0.0–0.2)

## 2018-10-17 LAB — ECHOCARDIOGRAM COMPLETE
HEIGHTINCHES: 68 in
Weight: 4240 oz

## 2018-10-17 LAB — HEPARIN LEVEL (UNFRACTIONATED)
Heparin Unfractionated: 0.21 IU/mL — ABNORMAL LOW (ref 0.30–0.70)
Heparin Unfractionated: 0.23 IU/mL — ABNORMAL LOW (ref 0.30–0.70)

## 2018-10-17 LAB — GLUCOSE, CAPILLARY: Glucose-Capillary: 94 mg/dL (ref 70–99)

## 2018-10-17 LAB — ANTITHROMBIN III: AntiThromb III Func: 62 % — ABNORMAL LOW (ref 75–120)

## 2018-10-17 LAB — HOMOCYSTEINE: Homocysteine: 9.3 umol/L (ref 0.0–15.0)

## 2018-10-17 MED ORDER — HEPARIN BOLUS VIA INFUSION
1000.0000 [IU] | Freq: Once | INTRAVENOUS | Status: AC
  Administered 2018-10-17: 1000 [IU] via INTRAVENOUS
  Filled 2018-10-17: qty 1000

## 2018-10-17 MED ORDER — APIXABAN 5 MG PO TABS: ORAL_TABLET | ORAL | 1 refills | Status: DC

## 2018-10-17 MED ORDER — APIXABAN 5 MG PO TABS: 5.0000 mg | ORAL_TABLET | Freq: Two times a day (BID) | ORAL | Status: DC

## 2018-10-17 MED ORDER — APIXABAN 5 MG PO TABS
10.0000 mg | ORAL_TABLET | Freq: Two times a day (BID) | ORAL | Status: DC
  Administered 2018-10-17: 10 mg via ORAL
  Filled 2018-10-17: qty 2

## 2018-10-17 MED ORDER — HEPARIN BOLUS VIA INFUSION
1400.0000 [IU] | Freq: Once | INTRAVENOUS | Status: AC
  Administered 2018-10-17: 1400 [IU] via INTRAVENOUS
  Filled 2018-10-17: qty 1400

## 2018-10-17 NOTE — Care Management Note (Signed)
Case Management Note  Patient Details  Name: Wonda OldsJohn Stephen Scronce MRN: [id number] Date of Birth: 08/04/1956  Subjective/Objective:      Patient is discharging to home today.  He is from home alone.  Starting on Eliquis for PE.  PAtient has Nurse, learning disabilitycommercial insurance.  [facility]  Gave patient 30 day free coupon card and co-pay assist card.  This RNCM called pharmacy and confirmed they would allow him to use the coupons as he does not have Medicare Part D and has Nurse, learning disabilitycommercial insurance.  No PCP. [facility] and made a new patient appointment for January 24th at 11am.   He has a very high deductible; [facility] for him with a sliding scale.  Otherwise, independent in all adl's.  Not further needs identified.             Action/Plan:   Expected Discharge Date:  10/17/18               Expected Discharge Plan:  Home/Self Care  In-House Referral:     Discharge planning Services  CM Consult  Post Acute Care Choice:    Choice offered to:     DME Arranged:    DME Agency:     HH Arranged:    HH Agency:     Status of Service:  Completed, signed off  If discussed at [employer] Length of Stay Meetings, dates discussed:    Additional Comments:  Sherren KernsJennifer L Conny Situ, RN 10/17/2018, 2:15 PM

## 2018-10-17 NOTE — Progress Notes (Signed)
Dwayne James to be D/C'd Home per MD order.  Discussed prescriptions and follow up appointments with the patient. Prescriptions given to patient, medication list explained in detail. Pt verbalized understanding. Vascular discharge instructions provided to patient.  Allergies as of 10/17/2018   No Known Allergies     Medication List    STOP taking these medications   diclofenac 75 MG EC tablet Commonly known as:  VOLTAREN   RABEprazole 20 MG tablet Commonly known as:  ACIPHEX     TAKE these medications   amphetamine-dextroamphetamine 30 MG 24 hr capsule Commonly known as:  ADDERALL XR Take 1 capsule (30 mg total) by mouth every morning as needed only to attempt weaning off.   apixaban 5 MG Tabs tablet Commonly known as:  ELIQUIS Take 10 mg PO BID for 7 days and then take 5 mg PO BID.   esomeprazole 20 MG capsule Commonly known as:  NEXIUM Take 20 mg by mouth daily as needed.   traMADol 50 MG tablet Commonly known as:  ULTRAM Take 50 mg by mouth every 6 (six) hours as needed.       Vitals:   10/17/18 1358 10/17/18 1538  BP:  (!) 154/91  Pulse:  67  Resp:    Temp:  98.6 F (37 C)  SpO2: 95% 96%    Tele box removed and returned. Skin clean, dry and intact without evidence of skin break down, no evidence of skin tears noted. IV catheter discontinued intact. Site without signs and symptoms of complications. Dressing and pressure applied. Pt denies pain at this time. No complaints noted.  An After Visit Summary was printed and given to the patient. Patient escorted via WC, and D/C home via private auto.  Dwayne NoelErica Y Amador James

## 2018-10-17 NOTE — Progress Notes (Addendum)
ANTICOAGULATION CONSULT NOTE  Pharmacy Consult for heparin Indication: pulmonary embolus  No Known Allergies  Patient Measurements: Height: 5\' 8"  (172.7 cm) Weight: 265 lb 6.9 oz (120.4 kg) IBW/kg (Calculated) : 68.4 Heparin Dosing Weight: 95.9 kg  Vital Signs: Temp: 97.7 F (36.5 C) (12/18 1032) Temp Source: Oral (12/18 1032) BP: 139/94 (12/18 1032) Pulse Rate: 62 (12/18 1032)  Labs: Recent Labs    10/16/18 0833 10/16/18 0834 10/16/18 2324 10/17/18 0356 10/17/18 1011  HGB 14.1  --   --  13.0  --   HCT 42.0  --   --  40.0  --   PLT 223  --   --  200  --   APTT  --  30  --   --   --   LABPROT  --  13.8  --   --   --   INR  --  1.07  --   --   --   HEPARINUNFRC  --   --  0.23* 0.21* 0.23*  CREATININE 0.77  --   --   --   --   TROPONINI <0.03  --   --   --   --     Estimated Creatinine Clearance: 119.2 mL/min (by C-G formula based on SCr of 0.77 mg/dL).   Medical History: Past Medical History:  Diagnosis Date  . COPD (chronic obstructive pulmonary disease) (HCC)   . GERD (gastroesophageal reflux disease)     Assessment: 62 year old male admitted with respiratory distress. CT shows submassive PE. No anticoagulation PTA.   Goal of Therapy:  Heparin level 0.3-0.7 units/ml Monitor platelets by anticoagulation protocol: Yes  HEPARIN COURSE Date Time HL Change 12/17 23:24 0.23 Increase rate to 1700 units/hr.  12/18 03:56 0.21 1000 unit bolus and increase rate to 1800 units/hr.  12/18 10:11 0.23 1400 unit bolus and increase rate to 2000 units/hr.   Plan:  HL subtherapeutic. 1400 unit bolus and increase rate to 2000 units/hr. Will recheck HL in 6 hours.  Carola FrostNathan A Britlee Skolnik, PharmD, BCPS Clinical Pharmacist 10/17/2018 10:52 AM

## 2018-10-17 NOTE — Progress Notes (Signed)
ANTICOAGULATION CONSULT NOTE  Pharmacy Consult for apixaban Indication: pulmonary embolus  No Known Allergies  Patient Measurements: Height: 5\' 8"  (172.7 cm) Weight: 265 lb 6.9 oz (120.4 kg) IBW/kg (Calculated) : 68.4 Heparin Dosing Weight: 95.9 kg  Vital Signs: Temp: 97.7 F (36.5 C) (12/18 1032) Temp Source: Oral (12/18 1032) BP: 139/94 (12/18 1032) Pulse Rate: 62 (12/18 1032)  Labs: Recent Labs    10/16/18 0833 10/16/18 0834 10/16/18 2324 10/17/18 0356 10/17/18 1011  HGB 14.1  --   --  13.0  --   HCT 42.0  --   --  40.0  --   PLT 223  --   --  200  --   APTT  --  30  --   --   --   LABPROT  --  13.8  --   --   --   INR  --  1.07  --   --   --   HEPARINUNFRC  --   --  0.23* 0.21* 0.23*  CREATININE 0.77  --   --   --   --   TROPONINI <0.03  --   --   --   --     Estimated Creatinine Clearance: 119.2 mL/min (by C-G formula based on SCr of 0.77 mg/dL).   Medical History: Past Medical History:  Diagnosis Date  . COPD (chronic obstructive pulmonary disease) (HCC)   . GERD (gastroesophageal reflux disease)     Assessment: 62 year old male admitted with respiratory distress. CT shows submassive PE. No anticoagulation PTA.   Goal of Therapy:  Heparin level 0.3-0.7 units/ml Monitor platelets by anticoagulation protocol: Yes  HEPARIN COURSE Date Time HL Change 12/17 23:24 0.23 Increase rate to 1700 units/hr.  12/18 03:56 0.21 1000 unit bolus and increase rate to 1800 units/hr.  12/18 10:11 0.23 1400 unit bolus and increase rate to 2000 units/hr.  12/18 11:59 -- Stop heparin, start apixaban  Plan:  Pharmacy consulted to switch UFH to apixaban. Start apixaban 10 mg po BID x 7 days followed by apixaban 5 mg po BID. Start apixaban at the time of heparin discontinuation. Check CBC and SCr every three days per protocol.  Carola FrostNathan A Jossalin Chervenak, PharmD, BCPS Clinical Pharmacist 10/17/2018 11:32 AM

## 2018-10-17 NOTE — Progress Notes (Signed)
SATURATION QUALIFICATIONS: (This note is used to comply with regulatory documentation for home oxygen)  Patient Saturations on Room Air at Rest = 97%  Patient Saturations on Room Air while Ambulating = 94%  Patient Saturations on n/a Liters of oxygen while Ambulating = n/a%  Please briefly explain why patient needs home oxygen: Pt ambulated around nurses station and tolerated well, no distress noted.

## 2018-10-17 NOTE — Discharge Summary (Signed)
[facility] [place] [facility]   PATIENT NAME: Ebubechukwu Jedlicka    MR#:  [id number]  DATE OF BIRTH:  1956-02-06  DATE OF ADMISSION:  10/16/2018 ADMITTING PHYSICIAN: Enedina Finner, MD  DATE OF DISCHARGE: 10/17/2018  PRIMARY CARE PHYSICIAN: Patient, No Pcp Per    ADMISSION DIAGNOSIS:  SOB (shortness of breath) [R06.02] Hypoxia [R09.02] Acute pulmonary embolism, unspecified pulmonary embolism type, unspecified whether acute cor pulmonale present (HCC) [I26.99]  DISCHARGE DIAGNOSIS:  Active Problems:   Pulmonary embolism (HCC)   SECONDARY DIAGNOSIS:   Past Medical History:  Diagnosis Date  . COPD (chronic obstructive pulmonary disease) (HCC)   . GERD (gastroesophageal reflux disease)     HOSPITAL COURSE:   62 year old male with past medical history of COPD, GERD who presented to the hospital due to worsening shortness of breath noted to have acute pulmonary embolism.  1.  Acute submassive pulmonary embolism-this was the cause of patient's worsening shortness of breath.  Patient was started on IV heparin drip a vascular surgery consult was obtained.  Vascular surgery thought patient would benefit from thrombectomy and thrombolysis.  Patient was taken for thrombectomy and thrombolysis on October 16, 2018 and postprocedure feels a lot better. - He has less shortness of breath and respiratory distress.  He was ambulated and is no longer hypoxic.  He was switched from IV heparin drip to oral Eliquis and is being discharged on that. - Patient's thrombophilia work-up was done and is currently pending.  Patient is being referred to outpatient hematology oncology for follow up.   2. GERD - pt. Will cont. His Nexium.   3. ADHD - pt. Will cont. His Adderall.    DISCHARGE CONDITIONS:   Stable.   CONSULTS OBTAINED:    DRUG ALLERGIES:  No Known Allergies  DISCHARGE MEDICATIONS:   Allergies as of 10/17/2018   No Known Allergies     Medication List    STOP  taking these medications   diclofenac 75 MG EC tablet Commonly known as:  VOLTAREN   RABEprazole 20 MG tablet Commonly known as:  ACIPHEX     TAKE these medications   amphetamine-dextroamphetamine 30 MG 24 hr capsule Commonly known as:  ADDERALL XR Take 1 capsule (30 mg total) by mouth every morning as needed only to attempt weaning off.   apixaban 5 MG Tabs tablet Commonly known as:  ELIQUIS Take 10 mg PO BID for 7 days and then take 5 mg PO BID.   esomeprazole 20 MG capsule Commonly known as:  NEXIUM Take 20 mg by mouth daily as needed.   traMADol 50 MG tablet Commonly known as:  ULTRAM Take 50 mg by mouth every 6 (six) hours as needed.         DISCHARGE INSTRUCTIONS:   DIET:  Regular diet  DISCHARGE CONDITION:  Stable  ACTIVITY:  Activity as tolerated  OXYGEN:  Home Oxygen: No.   Oxygen Delivery: room air  DISCHARGE LOCATION:  home   If you experience worsening of your admission symptoms, develop shortness of breath, life threatening emergency, suicidal or homicidal thoughts you must seek medical attention immediately by calling 911 or calling your MD immediately  if symptoms less severe.  You Must read complete instructions/literature along with all the possible adverse reactions/side effects for all the Medicines you take and that have been prescribed to you. Take any new Medicines after you have completely understood and accpet all the possible adverse reactions/side effects.   Please note  You were  cared for by a hospitalist during your hospital stay. If you have any questions about your discharge medications or the care you received while you were in the hospital after you are discharged, you can call the unit and asked to speak with the hospitalist on call if the hospitalist that took care of you is not available. Once you are discharged, your primary care physician will handle any further medical issues. Please note that NO REFILLS for any discharge  medications will be authorized once you are discharged, as it is imperative that you return to your primary care physician (or establish a relationship with a primary care physician if you do not have one) for your aftercare needs so that they can reassess your need for medications and monitor your lab values.     Today   Pt. Feels better.  No worsening shortness of breath. NO hemoptysis. Will d/c home today.   VITAL SIGNS:  Blood pressure (!) 139/94, pulse 62, temperature 97.7 F (36.5 C), temperature source Oral, resp. rate 20, height 5\' 8"  (1.727 m), weight 120.4 kg, SpO2 95 %.  I/O:    Intake/Output Summary (Last 24 hours) at 10/17/2018 1506 Last data filed at 10/17/2018 1354 Gross per 24 hour  Intake 1623.75 ml  Output 825 ml  Net 798.75 ml    PHYSICAL EXAMINATION:  GENERAL:  62 y.o.-year-old patient lying in the bed in no acute distress.  EYES: Pupils equal, round, reactive to light and accommodation. No scleral icterus. Extraocular muscles intact.  HEENT: Head atraumatic, normocephalic. Oropharynx and nasopharynx clear.  NECK:  Supple, no jugular venous distention. No thyroid enlargement, no tenderness.  LUNGS: Normal breath sounds bilaterally, no wheezing, rales,rhonchi. No use of accessory muscles of respiration.  CARDIOVASCULAR: S1, S2 normal. No murmurs, rubs, or gallops.  ABDOMEN: Soft, non-tender, non-distended. Bowel sounds present. No organomegaly or mass.  EXTREMITIES: No pedal edema, cyanosis, or clubbing.  NEUROLOGIC: Cranial nerves II through XII are intact. No focal motor or sensory defecits b/l.  PSYCHIATRIC: The patient is alert and oriented x 3.  SKIN: No obvious rash, lesion, or ulcer.   DATA REVIEW:   CBC Recent Labs  Lab 10/17/18 0356  WBC 7.5  HGB 13.0  HCT 40.0  PLT 200    Chemistries  Recent Labs  Lab 10/16/18 0833  NA 137  K 4.1  CL 107  CO2 23  GLUCOSE 126*  BUN 17  CREATININE 0.77  CALCIUM 8.3*  AST 24  ALT 18  ALKPHOS 38   BILITOT 0.8    Cardiac Enzymes Recent Labs  Lab 10/16/18 0833  TROPONINI <0.03    Microbiology Results  No results found for this or any previous visit.  RADIOLOGY:  Dg Chest 2 View  Result Date: 10/16/2018 CLINICAL DATA:  Shortness of breath EXAM: CHEST - 2 VIEW COMPARISON:  07/07/2005 FINDINGS: Heart and mediastinal contours are within normal limits. No focal opacities or effusions. No acute bony abnormality. IMPRESSION: No active cardiopulmonary disease. Electronically Signed   By: Charlett Nose M.D.   On: 10/16/2018 09:02   Ct Angio Chest Pe W And/or Wo Contrast  Result Date: 10/16/2018 CLINICAL DATA:  62 year old male with progressive shortness of breath. Acutely hypoxic. Chronic lung disease. Hypertensive. EXAM: CT ANGIOGRAPHY CHEST WITH CONTRAST TECHNIQUE: Multidetector CT imaging of the chest was performed using the standard protocol during bolus administration of intravenous contrast. Multiplanar CT image reconstructions and MIPs were obtained to evaluate the vascular anatomy. CONTRAST:  75mL ISOVUE-370 IOPAMIDOL (ISOVUE-370) INJECTION  76% COMPARISON:  Chest radiographs 0854 hours today and earlier. CT Abdomen and Pelvis 05/29/2016. FINDINGS: Cardiovascular: Suboptimal contrast bolus timing in the pulmonary arterial tree. However, bilateral pulmonary embolus is evident. Left upper lobe thrombus is most apparent on series 5, image 113. No central or saddle embolus. Right lower lobar and segmental thrombus as seen on series 5, image 142 and series 10, image 44. Left lower lobe basal segmental thrombus (series 5, image 193). Right middle lobe segmental thrombus on image 155. Right upper lobe segmental thrombus on image 92. RV / LV ratio = > 1, with straightening of the interventricular septum. No pericardial effusion. Mild cardiomegaly. No calcified coronary artery atherosclerosis is evident. There is mild Calcified aortic atherosclerosis. Mediastinum/Nodes: No mediastinal  lymphadenopathy. Lungs/Pleura: Major airways are patent. Mild paraseptal emphysema in the right upper lobe. No pleural effusion or confluent pulmonary opacity. Upper Abdomen: Negative visible liver, gallbladder, spleen, pancreas, adrenal glands, left kidney, and bowel in the upper abdomen. Musculoskeletal: Negative. Review of the MIP images confirms the above findings. IMPRESSION: 1. Positive for Acute Pulmonary Embolus. No central or saddle embolus but there is multifocal bilateral PE with CT evidence of right heart strain (RV/LV Ratio > 1) consistent with at least submassive (intermediate risk) PE. The presence of right heart strain has been associated with an increased risk of morbidity and mortality. Please activate Code PE by paging [phone]. 2. Both lungs are clear. Mild cardiomegaly, no pericardial or pleural effusion. Critical Value/emergent results were called by telephone at the time of interpretation on 10/16/2018 at 10:14 am to Dr. Rockne Menghini , who verbally acknowledged these results. Electronically Signed   By: Odessa Fleming M.D.   On: 10/16/2018 10:13   US Venous Img Lower Bilateral  Result Date: 10/16/2018 CLINICAL DATA:  History of pulmonary embolism, now with bilateral lower extremity pain. Shortness of breath. History of smoking. Evaluate for DVT. EXAM: BILATERAL LOWER EXTREMITY VENOUS DOPPLER ULTRASOUND TECHNIQUE: Gray-scale sonography with graded compression, as well as color Doppler and duplex ultrasound were performed to evaluate the lower extremity deep venous systems from the level of the common femoral vein and including the common femoral, femoral, profunda femoral, popliteal and calf veins including the posterior tibial, peroneal and gastrocnemius veins when visible. The superficial great saphenous vein was also interrogated. Spectral Doppler was utilized to evaluate flow at rest and with distal augmentation maneuvers in the common femoral, femoral and popliteal veins.  COMPARISON:  None. FINDINGS: RIGHT LOWER EXTREMITY Common Femoral Vein: No evidence of thrombus. Normal compressibility, respiratory phasicity and response to augmentation. Saphenofemoral Junction: No evidence of thrombus. Normal compressibility and flow on color Doppler imaging. Profunda Femoral Vein: No evidence of thrombus. Normal compressibility and flow on color Doppler imaging. Femoral Vein: No evidence of thrombus. Normal compressibility, respiratory phasicity and response to augmentation. Popliteal Vein: No evidence of thrombus. Normal compressibility, respiratory phasicity and response to augmentation. Calf Veins: No evidence of thrombus. Normal compressibility and flow on color Doppler imaging. Superficial Great Saphenous Vein: No evidence of thrombus. Normal compressibility. Venous Reflux:  None. Other Findings:  None. LEFT LOWER EXTREMITY Common Femoral Vein: No evidence of thrombus. Normal compressibility, respiratory phasicity and response to augmentation. Saphenofemoral Junction: No evidence of thrombus. Normal compressibility and flow on color Doppler imaging. Profunda Femoral Vein: No evidence of thrombus. Normal compressibility and flow on color Doppler imaging. Femoral Vein: No evidence of thrombus. Normal compressibility, respiratory phasicity and response to augmentation. Popliteal Vein: No evidence of thrombus. Normal compressibility, respiratory phasicity and  response to augmentation. Calf Veins: No evidence of thrombus. Normal compressibility and flow on color Doppler imaging. Superficial Great Saphenous Vein: No evidence of thrombus. Normal compressibility. Venous Reflux:  None. Other Findings:  None. IMPRESSION: No evidence of DVT within either lower extremity. Electronically Signed   By: Simonne ComeJohn  Watts M.D.   On: 10/16/2018 13:50      Management plans discussed with the patient, family and they are in agreement.  CODE STATUS:     Code Status Orders  (From admission, onward)          Start     Ordered   10/16/18 1810  Full code  Continuous     10/16/18 1810          TOTAL TIME TAKING CARE OF THIS PATIENT: 40 minutes.    Houston SirenSAINANI,VIVEK J M.D on 10/17/2018 at 3:06 PM  Between 7am to 6pm - Pager - [phone]  After 6pm go to [email or web address] - Social research officer, governmentpassword [email or web address]  [facility] [place] [facility]  Office  [phone]  CC: Primary care physician; Patient, No Pcp Per

## 2018-10-17 NOTE — Progress Notes (Addendum)
ANTICOAGULATION CONSULT NOTE  Pharmacy Consult for heparin Indication: pulmonary embolus  No Known Allergies  Patient Measurements: Height: 5\' 8"  (172.7 cm) Weight: 265 lb (120.2 kg) IBW/kg (Calculated) : 68.4 Heparin Dosing Weight: 95.9 kg  Vital Signs: Temp: 97.7 F (36.5 C) (12/17 1947) BP: 137/78 (12/17 1947) Pulse Rate: 65 (12/17 1947)  Labs: Recent Labs    10/16/18 0833 10/16/18 0834 10/16/18 2324  HGB 14.1  --   --   HCT 42.0  --   --   PLT 223  --   --   APTT  --  30  --   LABPROT  --  13.8  --   INR  --  1.07  --   HEPARINUNFRC  --   --  0.23*  CREATININE 0.77  --   --   TROPONINI <0.03  --   --     Estimated Creatinine Clearance: 119.1 mL/min (by C-G formula based on SCr of 0.77 mg/dL).   Medical History: Past Medical History:  Diagnosis Date  . COPD (chronic obstructive pulmonary disease) (HCC)   . GERD (gastroesophageal reflux disease)     Assessment: 62 year old male admitted with respiratory distress. CT shows submassive PE. No anticoagulation PTA.   Goal of Therapy:  Heparin level 0.3-0.7 units/ml Monitor platelets by anticoagulation protocol: Yes   Plan:  12/17 @ 2330 HL 0.23 subtherapeutic. Will increase rate to 1700 units/hr and will recheck HL/CBC w/ am labs.   Thomasene Rippleavid  Adarian Bur, PharmD Clinical Pharmacist 10/17/2018 12:27 AM

## 2018-10-17 NOTE — Progress Notes (Signed)
ANTICOAGULATION CONSULT NOTE  Pharmacy Consult for heparin Indication: pulmonary embolus  No Known Allergies  Patient Measurements: Height: 5\' 8"  (172.7 cm) Weight: 265 lb (120.2 kg) IBW/kg (Calculated) : 68.4 Heparin Dosing Weight: 95.9 kg  Vital Signs: Temp: 97.7 F (36.5 C) (12/18 0448) BP: 155/79 (12/18 0448) Pulse Rate: 54 (12/18 0448)  Labs: Recent Labs    10/16/18 0833 10/16/18 0834 10/16/18 2324 10/17/18 0356  HGB 14.1  --   --  13.0  HCT 42.0  --   --  40.0  PLT 223  --   --  200  APTT  --  30  --   --   LABPROT  --  13.8  --   --   INR  --  1.07  --   --   HEPARINUNFRC  --   --  0.23* 0.21*  CREATININE 0.77  --   --   --   TROPONINI <0.03  --   --   --     Estimated Creatinine Clearance: 119.1 mL/min (by C-G formula based on SCr of 0.77 mg/dL).   Medical History: Past Medical History:  Diagnosis Date  . COPD (chronic obstructive pulmonary disease) (HCC)   . GERD (gastroesophageal reflux disease)     Assessment: 62 year old male admitted with respiratory distress. CT shows submassive PE. No anticoagulation PTA.   Goal of Therapy:  Heparin level 0.3-0.7 units/ml Monitor platelets by anticoagulation protocol: Yes   Plan:  12/18 @ 0400 HL 0.21 subtherapeutic. Will bolus w/ heparin 1000 units IV x 1 and increase rate to 1800 units/hr CBC trended down will continue to monitor.   Thomasene Rippleavid  Saraih Lorton, PharmD Clinical Pharmacist 10/17/2018 5:13 AM

## 2018-10-18 LAB — PROTEIN S ACTIVITY: Protein S Activity: 120 % (ref 63–140)

## 2018-10-18 LAB — LUPUS ANTICOAGULANT PANEL
DRVVT: 42.1 s (ref 0.0–47.0)
PTT Lupus Anticoagulant: 34.8 s (ref 0.0–51.9)

## 2018-10-18 LAB — CARDIOLIPIN ANTIBODIES, IGG, IGM, IGA
Anticardiolipin IgA: <9 APL U/mL (ref 0–11)
Anticardiolipin IgG: <9 GPL U/mL (ref 0–14)
Anticardiolipin IgM: <9 MPL U/mL (ref 0–12)

## 2018-10-18 LAB — PROTEIN S, TOTAL: Protein S Ag, Total: 137 % (ref 60–150)

## 2018-10-18 LAB — PROTEIN C ACTIVITY: Protein C Activity: 112 % (ref 73–180)

## 2018-10-18 LAB — PROTEIN C, TOTAL: Protein C, Total: 93 % (ref 60–150)

## 2018-10-22 LAB — PROTHROMBIN GENE MUTATION

## 2018-10-22 LAB — FACTOR 5 LEIDEN

## 2018-11-01 ENCOUNTER — Encounter (INDEPENDENT_AMBULATORY_CARE_PROVIDER_SITE_OTHER): Payer: Self-pay | Admitting: Vascular Surgery

## 2018-11-01 ENCOUNTER — Ambulatory Visit (INDEPENDENT_AMBULATORY_CARE_PROVIDER_SITE_OTHER): Payer: BLUE CROSS/BLUE SHIELD | Admitting: Vascular Surgery

## 2018-11-01 VITALS — BP 160/91 | HR 84 | Resp 14 | Ht 68.0 in | Wt 264.4 lb

## 2018-11-01 DIAGNOSIS — Z87891 Personal history of nicotine dependence: Secondary | ICD-10-CM

## 2018-11-01 DIAGNOSIS — M545 Low back pain, unspecified: Secondary | ICD-10-CM

## 2018-11-01 DIAGNOSIS — I2694 Multiple subsegmental pulmonary emboli without acute cor pulmonale: Secondary | ICD-10-CM

## 2018-11-01 DIAGNOSIS — E7800 Pure hypercholesterolemia, unspecified: Secondary | ICD-10-CM

## 2018-11-01 DIAGNOSIS — G8929 Other chronic pain: Secondary | ICD-10-CM

## 2018-11-01 DIAGNOSIS — K219 Gastro-esophageal reflux disease without esophagitis: Secondary | ICD-10-CM

## 2018-11-01 MED ORDER — RABEPRAZOLE SODIUM 20 MG PO TBEC: 20.0000 mg | DELAYED_RELEASE_TABLET | Freq: Every day | ORAL | 11 refills | Status: DC

## 2018-11-01 MED ORDER — APIXABAN 5 MG PO TABS: ORAL_TABLET | ORAL | 11 refills | Status: DC

## 2018-11-01 NOTE — Progress Notes (Signed)
MRN : [id number]  Dwayne James is a 63 y.o. ([birth year]) male who presents with chief complaint of  Chief Complaint  Patient presents with  . Follow-up  .  History of Present Illness:   The patient returns to the office for followup and review status post angiogram with intervention.  Mechanical thrombectomy pulmonary emboli using penumbra cat 8 device of both the posterior medial and posterior basal branches of the right lower lobe with infusion of TPA for thrombolysis  on 10/16/2018.   The patient notes improvement in his breathing and pulmonary symptoms.  "I am breathing better than I have for months".    There have been no significant changes to the patient's overall health care.  The patient denies amaurosis fugax or recent TIA symptoms. There are no recent neurological changes noted.  The patient denies recent episodes of angina or shortness of breath.     Current Meds  Medication Sig  . apixaban (ELIQUIS) 5 MG TABS tablet Take 10 mg PO BID for 7 days and then take 5 mg PO BID.  Marland Kitchen esomeprazole (NEXIUM) 20 MG capsule Take 20 mg by mouth daily as needed.  . traMADol (ULTRAM) 50 MG tablet Take 50 mg by mouth every 6 (six) hours as needed.    Past Medical History:  Diagnosis Date  . COPD (chronic obstructive pulmonary disease) (HCC)   . GERD (gastroesophageal reflux disease)     Past Surgical History:  Procedure Laterality Date  . ANKLE ARTHROSCOPY    . NISSEN FUNDOPLICATION    . PULMONARY THROMBECTOMY N/A 10/16/2018   Procedure: PULMONARY THROMBECTOMY;  Surgeon: Renford Dills, MD;  Location: [facility];  Service: Cardiovascular;  Laterality: N/A;    Social History Social History   Tobacco Use  . Smoking status: Former Smoker    Last attempt to quit: 05/28/2004    Years since quitting: 14.4  . Smokeless tobacco: Never Used  Substance Use Topics  . Alcohol use: No  . Drug use: No    Family History No family history on file.  No  Known Allergies   REVIEW OF SYSTEMS (Negative unless checked)  Constitutional: [] Weight loss  [] Fever  [] Chills Cardiac: [] Chest pain   [] Chest pressure   [] Palpitations   [] Shortness of breath when laying flat   [x] Shortness of breath with exertion. Vascular:  [] Pain in legs with walking   [] Pain in legs at rest  [] History of DVT   [] Phlebitis   [] Swelling in legs   [] Varicose veins   [] Non-healing ulcers Pulmonary:   [] Uses home oxygen   [] Productive cough   [] Hemoptysis   [] Wheeze  [] COPD   [] Asthma Neurologic:  [] Dizziness   [] Seizures   [] History of stroke   [] History of TIA  [] Aphasia   [] Vissual changes   [] Weakness or numbness in arm   [] Weakness or numbness in leg Musculoskeletal:   [] Joint swelling   [x] Joint pain   [x] Low back pain Hematologic:  [] Easy bruising  [] Easy bleeding   [] Hypercoagulable state   [] Anemic Gastrointestinal:  [] Diarrhea   [] Vomiting  [] Gastroesophageal reflux/heartburn   [] Difficulty swallowing. Genitourinary:  [] Chronic kidney disease   [] Difficult urination  [] Frequent urination   [] Blood in urine Skin:  [] Rashes   [] Ulcers  Psychological:  [] History of anxiety   []  History of major depression.  Physical Examination  Vitals:   11/01/18 1301  BP: (!) 160/91  Pulse: 84  Resp: 14  Weight: 264 lb 6.4 oz (119.9 kg)  Height:  5\' 8"  (1.727 m)   Body mass index is 40.2 kg/m. Gen: WD/WN, NAD Head: [place]/AT, No temporalis wasting.  Ear/Nose/Throat: Hearing grossly intact, nares w/o erythema or drainage Eyes: PER, EOMI, sclera nonicteric.  Neck: Supple, no large masses.   Pulmonary:  Good air movement, no audible wheezing bilaterally, no use of accessory muscles.  Cardiac: RRR, no JVD Vascular:  Vessel Right Left  Radial Palpable Palpable  Gastrointestinal: Non-distended. No guarding/no peritoneal signs.  Musculoskeletal: M/S 5/5 throughout.  No deformity or atrophy.  Neurologic: CN 2-12 intact. Symmetrical.  Speech is fluent. Motor exam as listed  above. Psychiatric: Judgment intact, Mood & affect appropriate for pt's clinical situation. Dermatologic: No rashes or ulcers noted.  No changes consistent with cellulitis. Lymph : No lichenification or skin changes of chronic lymphedema.  CBC Lab Results  Component Value Date   WBC 7.5 10/17/2018   HGB 13.0 10/17/2018   HCT 40.0 10/17/2018   MCV 95.5 10/17/2018   PLT 200 10/17/2018    BMET    Component Value Date/Time   NA 137 10/16/2018 0833   K 4.1 10/16/2018 0833   CL 107 10/16/2018 0833   CO2 23 10/16/2018 0833   GLUCOSE 126 (H) 10/16/2018 0833   BUN 17 10/16/2018 0833   CREATININE 0.77 10/16/2018 0833   CALCIUM 8.3 (L) 10/16/2018 0833   GFRNONAA >60 10/16/2018 0833   GFRAA >60 10/16/2018 16100833   Estimated Creatinine Clearance: 119 mL/min (by C-G formula based on SCr of 0.77 mg/dL).  COAG Lab Results  Component Value Date   INR 1.07 10/16/2018    Radiology Dg Chest 2 View  Result Date: 10/16/2018 CLINICAL DATA:  Shortness of breath EXAM: CHEST - 2 VIEW COMPARISON:  07/07/2005 FINDINGS: Heart and mediastinal contours are within normal limits. No focal opacities or effusions. No acute bony abnormality. IMPRESSION: No active cardiopulmonary disease. Electronically Signed   By: Charlett NoseKevin  Dover M.D.   On: 10/16/2018 09:02   Ct Angio Chest Pe W And/or Wo Contrast  Result Date: 10/16/2018 CLINICAL DATA:  63 year old male with progressive shortness of breath. Acutely hypoxic. Chronic lung disease. Hypertensive. EXAM: CT ANGIOGRAPHY CHEST WITH CONTRAST TECHNIQUE: Multidetector CT imaging of the chest was performed using the standard protocol during bolus administration of intravenous contrast. Multiplanar CT image reconstructions and MIPs were obtained to evaluate the vascular anatomy. CONTRAST:  75mL ISOVUE-370 IOPAMIDOL (ISOVUE-370) INJECTION 76% COMPARISON:  Chest radiographs 0854 hours today and earlier. CT Abdomen and Pelvis 05/29/2016. FINDINGS: Cardiovascular: Suboptimal  contrast bolus timing in the pulmonary arterial tree. However, bilateral pulmonary embolus is evident. Left upper lobe thrombus is most apparent on series 5, image 113. No central or saddle embolus. Right lower lobar and segmental thrombus as seen on series 5, image 142 and series 10, image 44. Left lower lobe basal segmental thrombus (series 5, image 193). Right middle lobe segmental thrombus on image 155. Right upper lobe segmental thrombus on image 92. RV / LV ratio = > 1, with straightening of the interventricular septum. No pericardial effusion. Mild cardiomegaly. No calcified coronary artery atherosclerosis is evident. There is mild Calcified aortic atherosclerosis. Mediastinum/Nodes: No mediastinal lymphadenopathy. Lungs/Pleura: Major airways are patent. Mild paraseptal emphysema in the right upper lobe. No pleural effusion or confluent pulmonary opacity. Upper Abdomen: Negative visible liver, gallbladder, spleen, pancreas, adrenal glands, left kidney, and bowel in the upper abdomen. Musculoskeletal: Negative. Review of the MIP images confirms the above findings. IMPRESSION: 1. Positive for Acute Pulmonary Embolus. No central or saddle embolus but  there is multifocal bilateral PE with CT evidence of right heart strain (RV/LV Ratio > 1) consistent with at least submassive (intermediate risk) PE. The presence of right heart strain has been associated with an increased risk of morbidity and mortality. Please activate Code PE by paging [phone]. 2. Both lungs are clear. Mild cardiomegaly, no pericardial or pleural effusion. Critical Value/emergent results were called by telephone at the time of interpretation on 10/16/2018 at 10:14 am to Dr. Rockne Menghini , who verbally acknowledged these results. Electronically Signed   By: Odessa Fleming M.D.   On: 10/16/2018 10:13   US Venous Img Lower Bilateral  Result Date: 10/16/2018 CLINICAL DATA:  History of pulmonary embolism, now with bilateral lower extremity  pain. Shortness of breath. History of smoking. Evaluate for DVT. EXAM: BILATERAL LOWER EXTREMITY VENOUS DOPPLER ULTRASOUND TECHNIQUE: Gray-scale sonography with graded compression, as well as color Doppler and duplex ultrasound were performed to evaluate the lower extremity deep venous systems from the level of the common femoral vein and including the common femoral, femoral, profunda femoral, popliteal and calf veins including the posterior tibial, peroneal and gastrocnemius veins when visible. The superficial great saphenous vein was also interrogated. Spectral Doppler was utilized to evaluate flow at rest and with distal augmentation maneuvers in the common femoral, femoral and popliteal veins. COMPARISON:  None. FINDINGS: RIGHT LOWER EXTREMITY Common Femoral Vein: No evidence of thrombus. Normal compressibility, respiratory phasicity and response to augmentation. Saphenofemoral Junction: No evidence of thrombus. Normal compressibility and flow on color Doppler imaging. Profunda Femoral Vein: No evidence of thrombus. Normal compressibility and flow on color Doppler imaging. Femoral Vein: No evidence of thrombus. Normal compressibility, respiratory phasicity and response to augmentation. Popliteal Vein: No evidence of thrombus. Normal compressibility, respiratory phasicity and response to augmentation. Calf Veins: No evidence of thrombus. Normal compressibility and flow on color Doppler imaging. Superficial Great Saphenous Vein: No evidence of thrombus. Normal compressibility. Venous Reflux:  None. Other Findings:  None. LEFT LOWER EXTREMITY Common Femoral Vein: No evidence of thrombus. Normal compressibility, respiratory phasicity and response to augmentation. Saphenofemoral Junction: No evidence of thrombus. Normal compressibility and flow on color Doppler imaging. Profunda Femoral Vein: No evidence of thrombus. Normal compressibility and flow on color Doppler imaging. Femoral Vein: No evidence of thrombus.  Normal compressibility, respiratory phasicity and response to augmentation. Popliteal Vein: No evidence of thrombus. Normal compressibility, respiratory phasicity and response to augmentation. Calf Veins: No evidence of thrombus. Normal compressibility and flow on color Doppler imaging. Superficial Great Saphenous Vein: No evidence of thrombus. Normal compressibility. Venous Reflux:  None. Other Findings:  None. IMPRESSION: No evidence of DVT within either lower extremity. Electronically Signed   By: Simonne Come M.D.   On: 10/16/2018 13:50      Assessment/Plan 1. Multiple subsegmental pulmonary emboli without acute cor pulmonale Recommend:   No surgery or intervention at this point in time.  IVC filter is not indicated at present.  Patient's duplex ultrasound of the venous system did not show DVT in the lower extemities.   The patient is initiated on anticoagulation; he is taking Eliquis  Elevation was stressed, use of a recliner was discussed.  I have had a long discussion with the patient regarding PE.  The patient will wear graduated compression stockings class 1 (20-30 mmHg), beginning after three full days of anticoagulation, on a daily basis a prescription was given. The patient will  beginning wearing the stockings first thing in the morning and removing them in the evening. The  patient is instructed specifically not to sleep in the stockings.  In addition, behavioral modification including elevation during the day and avoidance of prolonged dependency will be initiated.    The patient will continue anticoagulation for now as there have not been any problems or complications at this point.   - VAS [place] LOWER EXTREMITY VENOUS (DVT); Future  2. Hypercholesterolemia Continue statin as ordered and reviewed, no changes at this time   3. Chronic bilateral low back pain without sciatica Continue NSAID medications as already ordered, these medications have been reviewed and there are no  changes at this time.  Continued activity and therapy was stressed.   4. Gastroesophageal reflux disease without esophagitis Continue PPI as already ordered and I will give him a new Rx (he has not yet established with a new primary care), this medication has been reviewed and there are no changes at this time.  Avoidence of caffeine and alcohol  Moderate elevation of the head of the bed     [name], MD  11/01/2018 1:15 PM

## 2019-05-02 ENCOUNTER — Encounter (INDEPENDENT_AMBULATORY_CARE_PROVIDER_SITE_OTHER): Payer: Self-pay | Admitting: Vascular Surgery

## 2019-05-02 ENCOUNTER — Ambulatory Visit (INDEPENDENT_AMBULATORY_CARE_PROVIDER_SITE_OTHER): Payer: BLUE CROSS/BLUE SHIELD | Admitting: Vascular Surgery

## 2019-05-02 ENCOUNTER — Ambulatory Visit (INDEPENDENT_AMBULATORY_CARE_PROVIDER_SITE_OTHER): Payer: BLUE CROSS/BLUE SHIELD

## 2019-05-02 ENCOUNTER — Other Ambulatory Visit: Payer: Self-pay

## 2019-05-02 VITALS — BP 150/98 | HR 72 | Resp 16 | Ht 68.0 in | Wt 258.4 lb

## 2019-05-02 DIAGNOSIS — E7800 Pure hypercholesterolemia, unspecified: Secondary | ICD-10-CM

## 2019-05-02 DIAGNOSIS — K219 Gastro-esophageal reflux disease without esophagitis: Secondary | ICD-10-CM | POA: Diagnosis not present

## 2019-05-02 DIAGNOSIS — I2694 Multiple subsegmental pulmonary emboli without acute cor pulmonale: Secondary | ICD-10-CM

## 2019-05-02 DIAGNOSIS — M2391 Unspecified internal derangement of right knee: Secondary | ICD-10-CM

## 2019-05-02 DIAGNOSIS — Z79899 Other long term (current) drug therapy: Secondary | ICD-10-CM

## 2019-05-02 DIAGNOSIS — Z7901 Long term (current) use of anticoagulants: Secondary | ICD-10-CM

## 2019-05-02 DIAGNOSIS — Z9889 Other specified postprocedural states: Secondary | ICD-10-CM

## 2019-05-02 NOTE — Progress Notes (Signed)
MRN : [id number]  Dwayne James is a 63 y.o. (03/03/1956) male who presents with chief complaint of  Chief Complaint  Patient presents with  . Follow-up    ultrasound follow up  .  History of Present Illness:  The patient returns to the office for followup and review status post angiogram with intervention.  Mechanical thrombectomy pulmonary emboli using penumbra cat 8deviceof both the posterior medial and posterior basal branches of the right lower lobe with infusion of TPA for thrombolysison 10/16/2018.   The patient notes improvement in his breathing and pulmonary symptoms.  Initially he stated "I am breathing better than I have for months".  Today he states his breathing is great.  There have been no significant changes to the patient's overall health care.  The patient denies amaurosis fugax or recent TIA symptoms. There are no recent neurological changes noted.  The patient denies recent episodes of angina or shortness of breath.   Current Meds  Medication Sig  . apixaban (ELIQUIS) 5 MG TABS tablet take 5 mg PO BID.  Marland Kitchen. RABEprazole (ACIPHEX) 20 MG tablet Take 1 tablet (20 mg total) by mouth daily.  . traMADol (ULTRAM) 50 MG tablet Take 50 mg by mouth every 6 (six) hours as needed.    Past Medical History:  Diagnosis Date  . COPD (chronic obstructive pulmonary disease) (HCC)   . GERD (gastroesophageal reflux disease)     Past Surgical History:  Procedure Laterality Date  . ANKLE ARTHROSCOPY    . NISSEN FUNDOPLICATION    . PULMONARY THROMBECTOMY N/A 10/16/2018   Procedure: PULMONARY THROMBECTOMY;  Surgeon: Renford DillsSchnier, Erykah Lippert G, MD;  Location: [facility];  Service: Cardiovascular;  Laterality: N/A;    Social History Social History   Tobacco Use  . Smoking status: Former Smoker    Quit date: 05/28/2004    Years since quitting: 14.9  . Smokeless tobacco: Never Used  Substance Use Topics  . Alcohol use: No  . Drug use: No    Family  History Family History  Problem Relation Age of Onset  . Heart disease Father   . COPD Father   . Emphysema Father   . Stroke Father     No Known Allergies   REVIEW OF SYSTEMS (Negative unless checked)  Constitutional: [] Weight loss  [] Fever  [] Chills Cardiac: [] Chest pain   [] Chest pressure   [] Palpitations   [] Shortness of breath when laying flat   [] Shortness of breath with exertion. Vascular:  [] Pain in legs with walking   [] Pain in legs at rest  [x] History of DVT   [] Phlebitis   [] Swelling in legs   [] Varicose veins   [] Non-healing ulcers Pulmonary:   [] Uses home oxygen   [] Productive cough   [] Hemoptysis   [] Wheeze  [] COPD   [] Asthma Neurologic:  [] Dizziness   [] Seizures   [] History of stroke   [] History of TIA  [] Aphasia   [] Vissual changes   [] Weakness or numbness in arm   [] Weakness or numbness in leg Musculoskeletal:   [] Joint swelling   [x] Joint pain   [] Low back pain Hematologic:  [] Easy bruising  [] Easy bleeding   [] Hypercoagulable state   [] Anemic Gastrointestinal:  [] Diarrhea   [] Vomiting  [x] Gastroesophageal reflux/heartburn   [] Difficulty swallowing. Genitourinary:  [] Chronic kidney disease   [] Difficult urination  [] Frequent urination   [] Blood in urine Skin:  [] Rashes   [] Ulcers  Psychological:  [] History of anxiety   []  History of major depression.  Physical Examination  Vitals:  05/02/19 1408  BP: (!) 150/98  Pulse: 72  Resp: 16  Weight: 258 lb 6.4 oz (117.2 kg)  Height: 5\' 8"  (1.727 m)   Body mass index is 39.29 kg/m. Gen: WD/WN, NAD Head: [place]/AT, No temporalis wasting.  Ear/Nose/Throat: Hearing grossly intact, nares w/o erythema or drainage Eyes: PER, EOMI, sclera nonicteric.  Neck: Supple, no large masses.   Pulmonary:  Good air movement, no audible wheezing bilaterally, no use of accessory muscles.  Cardiac: RRR, no JVD Vascular: scattered varicosities present bilaterally.  Mild venous stasis changes to the legs bilaterally.  1+ soft pitting edema  Vessel Right Left  Radial Palpable Palpable  PT Palpable Palpable  DP Palpable Palpable  Gastrointestinal: Non-distended. No guarding/no peritoneal signs.  Musculoskeletal: M/S 5/5 throughout.  No deformity or atrophy.  Neurologic: CN 2-12 intact. Symmetrical.  Speech is fluent. Motor exam as listed above. Psychiatric: Judgment intact, Mood & affect appropriate for pt's clinical situation. Dermatologic: Mild venous rashes no ulcers noted.  No changes consistent with cellulitis. Lymph : No lichenification or skin changes of chronic lymphedema.  CBC Lab Results  Component Value Date   WBC 7.5 10/17/2018   HGB 13.0 10/17/2018   HCT 40.0 10/17/2018   MCV 95.5 10/17/2018   PLT 200 10/17/2018    BMET    Component Value Date/Time   NA 137 10/16/2018 0833   K 4.1 10/16/2018 0833   CL 107 10/16/2018 0833   CO2 23 10/16/2018 0833   GLUCOSE 126 (H) 10/16/2018 0833   BUN 17 10/16/2018 0833   CREATININE 0.77 10/16/2018 0833   CALCIUM 8.3 (L) 10/16/2018 0833   GFRNONAA >60 10/16/2018 0833   GFRAA >60 10/16/2018 0833   CrCl cannot be calculated (Patient's most recent lab result is older than the maximum 21 days allowed.).  COAG Lab Results  Component Value Date   INR 1.07 10/16/2018    Radiology No results found.   Assessment/Plan 1. Multiple subsegmental pulmonary emboli without acute cor pulmonale Recommend:  The patient is now status post successful pulmonary thrombectomy with dramatic improvement in his pulmonary function.  Duplex ultrasound obtained today is negative for residual thrombus, no significant chronic changes were identified.  He is now completed 6 months of anticoagulation.  He will continue for 6 more months and then anticoagulation can be stopped.  No surgery or intervention at this point in time.  IVC filter is not indicated at present.  Patient's duplex ultrasound of the venous system shows DVT from the popliteal to the femoral veins.  The patient is  initiated on anticoagulation   Elevation was stressed, use of a recliner was discussed.  I have had a long discussion with the patient regarding DVT and post phlebitic changes such as swelling and why it  causes symptoms such as pain.  The patient will wear graduated compression stockings class 1 (20-30 mmHg), beginning after three full days of anticoagulation, on a daily basis a prescription was given. The patient will  beginning wearing the stockings first thing in the morning and removing them in the evening. The patient is instructed specifically not to sleep in the stockings.  In addition, behavioral modification including elevation during the day and avoidance of prolonged dependency will be initiated.    The patient will continue anticoagulation for now as there have not been any problems or complications at this point.    2. Gastroesophageal reflux disease without esophagitis Continue PPI as already ordered, this medication has been reviewed and there are no  changes at this time.  Avoidence of caffeine and alcohol  Moderate elevation of the head of the bed   3. Hypercholesterolemia Continue statin as ordered and reviewed, no changes at this time   4. Internal derangement of knee joint, right Continue NSAID medications as already ordered, these medications have been reviewed and there are no changes at this time.  Continued activity and therapy was stressed.     Levora DredgeGregory Add Dinapoli, MD  05/02/2019 2:12 PM

## 2019-11-04 ENCOUNTER — Ambulatory Visit (INDEPENDENT_AMBULATORY_CARE_PROVIDER_SITE_OTHER): Payer: BLUE CROSS/BLUE SHIELD | Admitting: Vascular Surgery

## 2020-04-30 HISTORY — PX: ATRIAL FIBRILLATION ABLATION: SHX5732

## 2021-01-19 DIAGNOSIS — E785 Hyperlipidemia, unspecified: Secondary | ICD-10-CM | POA: Insufficient documentation

## 2021-01-19 DIAGNOSIS — I480 Paroxysmal atrial fibrillation: Secondary | ICD-10-CM | POA: Insufficient documentation

## 2021-01-19 DIAGNOSIS — Z86711 Personal history of pulmonary embolism: Secondary | ICD-10-CM | POA: Insufficient documentation

## 2021-01-19 DIAGNOSIS — I10 Essential (primary) hypertension: Secondary | ICD-10-CM | POA: Insufficient documentation

## 2021-01-19 DIAGNOSIS — Z720 Tobacco use: Secondary | ICD-10-CM | POA: Insufficient documentation

## 2021-05-13 DIAGNOSIS — I519 Heart disease, unspecified: Secondary | ICD-10-CM | POA: Insufficient documentation

## 2021-05-13 DIAGNOSIS — E669 Obesity, unspecified: Secondary | ICD-10-CM | POA: Insufficient documentation

## 2021-05-18 ENCOUNTER — Telehealth (INDEPENDENT_AMBULATORY_CARE_PROVIDER_SITE_OTHER): Payer: Self-pay | Admitting: Gastroenterology

## 2021-05-18 VITALS — Ht 68.0 in | Wt 258.0 lb

## 2021-05-18 DIAGNOSIS — Z86010 Personal history of colonic polyps: Secondary | ICD-10-CM

## 2021-05-18 MED ORDER — PEG 3350-KCL-NA BICARB-NACL 420 G PO SOLR: 4000.0000 mL | Freq: Once | ORAL | 0 refills | Status: AC

## 2021-05-18 NOTE — Progress Notes (Signed)
Gastroenterology Pre-Procedure Review  Request Date: 06/17/21 Requesting Physician: Dr. Allegra Lai  PATIENT REVIEW QUESTIONS: The patient responded to the following health history questions as indicated:    1. Are you having any GI issues?  GERD issues 2. Do you have a personal history of Polyps? yes (2013) 3. Do you have a family history of Colon Cancer or Polyps? yes (brother colon polyps) 4. Diabetes Mellitus? no 5. Joint replacements in the past 12 months?no 6. Major health problems in the past 3 months?no 7. Any artificial heart valves, MVP, or defibrillator?no    MEDICATIONS & ALLERGIES:    Patient reports the following regarding taking any anticoagulation/antiplatelet therapy:   Plavix, Coumadin, Eliquis, Xarelto, Lovenox, Pradaxa, Brilinta, or Effient?  Pt states he has been off Eliquis for a month now. Not longer taken. Aspirin? no  Patient confirms/reports the following medications:  Current Outpatient Medications  Medication Sig Dispense Refill   apixaban (ELIQUIS) 5 MG TABS tablet take 5 mg PO BID. 60 tablet 11   esomeprazole (NEXIUM) 20 MG capsule Take 20 mg by mouth daily as needed.     RABEprazole (ACIPHEX) 20 MG tablet Take 1 tablet (20 mg total) by mouth daily. 30 tablet 11   traMADol (ULTRAM) 50 MG tablet Take 50 mg by mouth every 6 (six) hours as needed.     No current facility-administered medications for this visit.    Patient confirms/reports the following allergies:  No Known Allergies  No orders of the defined types were placed in this encounter.   AUTHORIZATION INFORMATION Primary Insurance: 1D#: Group #:  Secondary Insurance: 1D#: Group #:  SCHEDULE INFORMATION: Date: 06/17/21 Time: Location: [facility]

## 2021-05-26 ENCOUNTER — Other Ambulatory Visit: Payer: Self-pay

## 2021-05-27 ENCOUNTER — Telehealth: Payer: Self-pay

## 2021-05-27 NOTE — Telephone Encounter (Signed)
Informed patient we received cardiac clearance back from the cardiologist office. He has been cleared for procedure. Should stop aspirin 5 to 7 days prior to colonoscopy. Pt verbalized understanding. Office notes will be scanned into chart.

## 2021-06-09 ENCOUNTER — Encounter: Payer: Self-pay | Admitting: Gastroenterology

## 2021-06-09 NOTE — Anesthesia Preprocedure Evaluation (Addendum)
Anesthesia Evaluation  Patient identified by MRN, date of birth, ID band Patient awake    Reviewed: Allergy & Precautions, H&P , NPO status , Patient's Chart, lab work & pertinent test results, reviewed documented beta blocker date and time   Airway Mallampati: II  TM Distance: >3 FB Neck ROM: full    Dental no notable dental hx.    Pulmonary COPD, former smoker,    Pulmonary exam normal breath sounds clear to auscultation       Cardiovascular Exercise Tolerance: Good hypertension, +CHF (EF 40%)  negative cardio ROS  + dysrhythmias (a fib s/p ablation)  Rhythm:regular Rate:Normal  Hx PE  EKG 05/13/21: normal sinus rhythm with frequent PVCs, PACs, 98 bpm, normal QRS, QTC 459 ms, no significant ST-T wave changes.  TEE 12/25/19: normal atrial size and no thrombus. LV EF 40% with global hypokinesis.  Nuclear stress test 12/25/19: Small fixed apical defect likely artifact. Noischemia. EF 40% with globally.   Neuro/Psych PSYCHIATRIC DISORDERS Depression negative psych ROS   GI/Hepatic Neg liver ROS, GERD (s/p Nissen)  ,  Endo/Other  negative endocrine ROSObesity   Renal/GU negative Renal ROS  negative genitourinary   Musculoskeletal   Abdominal   Peds  Hematology negative hematology ROS (+)   Anesthesia Other Findings Cardiology note 05/13/21:  Preoperative cardiovascular risk assessment:  Patient plans to undergo colonoscopy soon. He has history of paroxysmal atrial fibrillation s/p successful ablation on 04/2020, mild systolic dysfunction-felt tachycardia mediated, and uncontrolled hypertension. He was maintaining normal sinus rhythm during my recent evaluation as detailed below. Has remained fairly stable from cardiac standpoint however blood pressure was significantly elevated prompting escalation of antihypertensive regimen. He will be at low risk for MACE undergoing low risk procedure at this time.  -He was recommended  to continue Xarelto 20 mg p.o. daily long-term given unprovoked PE however he was not able to afford it. In the meantime, he was started on aspirin 81 mg p.o. daily. -He can hold aspirin 5 to 7 days prior to the upcoming procedure. Restart soon after as appropriate. -Continue current beta-blocker therapy, other hypertensive regimen perioperatively. Prefer to keep blood pressure less than 130/80 mmHg.  Patient PCP: Dwayne Rainwater, MD  Assessment and Plan Recommendations:  Patient with uncontrolled hypertension. Home blood pressure averages around 145/100s. Has occasional low blood pressure in 100-120 systolic with associated symptoms of lightheadedness. No syncope.  -Orthostatic vital signs negative. Has appropriate blood pressure response with position change. -Increase losartan 50 mg p.o. twice daily, metoprolol succinate 25 mg p.o. twice daily for better blood pressure control. -Monitor blood pressure regularly and notify us if it remains elevated. -Continue to follow 2 g sodium restricted diet, increase activity/exercise.  Has paroxysmal atrial fibrillation s/p successful ablation 05/05/2020. No recurrent PAF since then. -EKG today reveals normal sinus rhythm with frequent PACs and PVCs. -Continue metoprolol succinate as above for backup rate control as well as ectopy suppression. -He discontinued Xarelto about a month ago given high cost. Long-term Xarelto was recommended given unprovoked PE. -Agrees to start aspirin 81 mg p.o. daily in the meantime.  Has mild systolic dysfunction with EF 40% per echo on 12/25/2019, felt to be tachycardia mediated. -He denies any overt heart failure symptoms. Weight somewhat stable. -Continue losartan, metoprolol as above. -Has not required any as needed Lasix recently. -Continue dietary restriction as above.  -Continue ezetimibe at current dose to keep goal LDL less than 100 minute/dL, preferably less than 70 mg/dL. -Follow heart healthy diet, regular  exercise  to help lose weight. -Reportedly had blood work done about a month ago at PCPs office and was told everything looked fine. We will request records.  Patient is obese but denies any snoring. Continue working on diet and exercise to help lose weight.  Has been battling with depression for last 6 to 7 years. In the depression changed to Cymbalta and Abilify recently. Plans to start counseling therapy soon.  Follow Up In 3-4 months, or sooner if needed.   Reproductive/Obstetrics negative OB ROS                            Anesthesia Physical Anesthesia Plan  ASA: 3  Anesthesia Plan: General   Post-op Pain Management:    Induction:   PONV Risk Score and Plan: 2 and Propofol infusion and Treatment may vary due to age or medical condition  Airway Management Planned:   Additional Equipment:   Intra-op Plan:   Post-operative Plan:   Informed Consent: I have reviewed the patients History and Physical, chart, labs and discussed the procedure including the risks, benefits and alternatives for the proposed anesthesia with the patient or authorized representative who has indicated his/her understanding and acceptance.     Dental Advisory Given  Plan Discussed with: CRNA  Anesthesia Plan Comments:         Anesthesia Quick Evaluation

## 2021-06-17 ENCOUNTER — Encounter: Payer: Self-pay | Admitting: Gastroenterology

## 2021-06-17 ENCOUNTER — Ambulatory Visit: Payer: Medicare Other | Admitting: Anesthesiology

## 2021-06-17 ENCOUNTER — Encounter
Admission: RE | Disposition: A | Discharge status: Home / Self Care | Payer: Self-pay | Source: Home / Self Care | Attending: Gastroenterology

## 2021-06-17 ENCOUNTER — Other Ambulatory Visit: Payer: Self-pay

## 2021-06-17 ENCOUNTER — Ambulatory Visit
Admission: RE | Admit: 2021-06-17 | Discharge: 2021-06-17 | Disposition: A | Discharge status: Home / Self Care | Payer: Medicare Other | Attending: Gastroenterology | Admitting: Gastroenterology

## 2021-06-17 DIAGNOSIS — Z7982 Long term (current) use of aspirin: Secondary | ICD-10-CM | POA: Diagnosis not present

## 2021-06-17 DIAGNOSIS — Z79899 Other long term (current) drug therapy: Secondary | ICD-10-CM | POA: Insufficient documentation

## 2021-06-17 DIAGNOSIS — K644 Residual hemorrhoidal skin tags: Secondary | ICD-10-CM | POA: Diagnosis not present

## 2021-06-17 DIAGNOSIS — Z86010 Personal history of colonic polyps: Secondary | ICD-10-CM

## 2021-06-17 DIAGNOSIS — Z87891 Personal history of nicotine dependence: Secondary | ICD-10-CM | POA: Insufficient documentation

## 2021-06-17 DIAGNOSIS — Z1211 Encounter for screening for malignant neoplasm of colon: Secondary | ICD-10-CM

## 2021-06-17 HISTORY — DX: Unspecified atrial fibrillation: I48.91

## 2021-06-17 HISTORY — DX: Prediabetes: R73.03

## 2021-06-17 HISTORY — PX: COLONOSCOPY WITH PROPOFOL: SHX5780

## 2021-06-17 SURGERY — COLONOSCOPY WITH PROPOFOL
Anesthesia: General | Site: Rectum

## 2021-06-17 MED ORDER — SODIUM CHLORIDE 0.9 % IV SOLN: INTRAVENOUS | Status: DC

## 2021-06-17 MED ORDER — LACTATED RINGERS IV SOLN: INTRAVENOUS | Status: DC

## 2021-06-17 MED ORDER — STERILE WATER FOR IRRIGATION IR SOLN
Status: DC | PRN
  Administered 2021-06-17: .05 mL

## 2021-06-17 MED ORDER — LIDOCAINE HCL (CARDIAC) PF 100 MG/5ML IV SOSY
PREFILLED_SYRINGE | INTRAVENOUS | Status: DC | PRN
  Administered 2021-06-17: 30 mg via INTRAVENOUS

## 2021-06-17 MED ORDER — PROPOFOL 10 MG/ML IV BOLUS
INTRAVENOUS | Status: DC | PRN
  Administered 2021-06-17 (×2): 30 mg via INTRAVENOUS
  Administered 2021-06-17 (×2): 20 mg via INTRAVENOUS
  Administered 2021-06-17: 150 mg via INTRAVENOUS
  Administered 2021-06-17: 20 mg via INTRAVENOUS
  Administered 2021-06-17: 30 mg via INTRAVENOUS

## 2021-06-17 SURGICAL SUPPLY — 6 items
GOWN CVR UNV OPN BCK APRN NK (MISCELLANEOUS) ×2 IMPLANT
GOWN ISOL THUMB LOOP REG UNIV (MISCELLANEOUS) ×4
KIT PRC NS LF DISP ENDO (KITS) ×1 IMPLANT
KIT PROCEDURE OLYMPUS (KITS) ×2
MANIFOLD NEPTUNE II (INSTRUMENTS) ×2 IMPLANT
WATER STERILE IRR 250ML POUR (IV SOLUTION) ×2 IMPLANT

## 2021-06-17 NOTE — Anesthesia Postprocedure Evaluation (Signed)
Anesthesia Post Note  Patient: Dwayne James  Procedure(s) Performed: COLONOSCOPY WITH PROPOFOL (Rectum)     Patient location during evaluation: PACU Anesthesia Type: General Level of consciousness: awake and alert Pain management: pain level controlled Vital Signs Assessment: post-procedure vital signs reviewed and stable Respiratory status: spontaneous breathing, nonlabored ventilation, respiratory function stable and patient connected to nasal cannula oxygen Cardiovascular status: blood pressure returned to baseline and stable Postop Assessment: no apparent nausea or vomiting Anesthetic complications: no   No notable events documented.  Loretto Belinsky, Salvadore Dom

## 2021-06-17 NOTE — H&P (Signed)
Arlyss Repress, MD [address]  [address]  [place], [place] [address]  Main: [phone]  Fax: [phone] Pager: [phone]  Primary Care Physician:  Racheal Patches, MD Primary Gastroenterologist:  Dr. Arlyss Repress  Pre-Procedure History & Physical: HPI:  Dwayne James is a 65 y.o. male is here for an colonoscopy.   Past Medical History:  Diagnosis Date   Atrial fibrillation (HCC)    COPD (chronic obstructive pulmonary disease) (HCC)    GERD (gastroesophageal reflux disease)    Prediabetes    Seizure (HCC) 2009   x 1, pt reports it was related to an antidepressant he was taking    Past Surgical History:  Procedure Laterality Date   ANKLE ARTHROSCOPY Left    ATRIAL FIBRILLATION ABLATION  04/2020   CARDIOVERSION     x 2   KNEE ARTHROSCOPY Right 2009   NISSEN FUNDOPLICATION     PULMONARY THROMBECTOMY N/A 10/16/2018   Procedure: PULMONARY THROMBECTOMY;  Surgeon: Renford Dills, MD;  Location: [facility] INVASIVE CV LAB;  Service: Cardiovascular;  Laterality: N/A;    Prior to Admission medications   Medication Sig Start Date End Date Taking? Authorizing Provider  amphetamine-dextroamphetamine (ADDERALL) 20 MG tablet Take 20 mg by mouth daily.   Yes [provider]  ARIPiprazole (ABILIFY) 15 MG tablet Take 15 mg by mouth daily.   Yes [provider]  aspirin EC 81 MG tablet Take 81 mg by mouth daily. Swallow whole.   Yes [provider]  DULoxetine (CYMBALTA) 60 MG capsule Take 60 mg by mouth 2 (two) times daily.   Yes [provider]  ezetimibe (ZETIA) 10 MG tablet Take 10 mg by mouth daily.   Yes [provider]  losartan (COZAAR) 50 MG tablet Take 50 mg by mouth 2 (two) times daily.   Yes [provider]  metoprolol tartrate (LOPRESSOR) 25 MG tablet Take 25 mg by mouth 2 (two) times daily.   Yes [provider]  RABEprazole (ACIPHEX) 20 MG tablet Take 1 tablet (20 mg total) by mouth  daily. 11/01/18  Yes Schnier, Latina Craver, MD  SEMAGLUTIDE-WEIGHT MANAGEMENT [place] Inject into the skin once a week.   Yes [provider]  testosterone enanthate (DELATESTRYL) 200 MG/ML injection Inject into the muscle every 7 (seven) days. For IM use only   Yes [provider]  traMADol (ULTRAM) 50 MG tablet Take 50 mg by mouth every 6 (six) hours as needed.   Yes [provider]  apixaban (ELIQUIS) 5 MG TABS tablet take 5 mg PO BID. Patient not taking: Reported on 05/18/2021 11/01/18   Schnier, Latina Craver, MD  esomeprazole (NEXIUM) 20 MG capsule Take 20 mg by mouth daily as needed. Patient not taking: Reported on 06/09/2021    [provider]    Allergies as of 05/18/2021   (No Known Allergies)    Family History  Problem Relation Age of Onset   Heart disease Father    COPD Father    Emphysema Father    Stroke Father     Social History   Socioeconomic History   Marital status: Widowed    Spouse name: Not on file   Number of children: Not on file   Years of education: Not on file   Highest education level: Not on file  Occupational History   Not on file  Tobacco Use   Smoking status: Former    Types: Cigarettes    Quit date: 05/28/2004  Years since quitting: 17.0   Smokeless tobacco: Never  Substance and Sexual Activity   Alcohol use: Yes    Comment: 2-3 drinks 4 days a week (liquor, beer, & wine)   Drug use: No   Sexual activity: Not on file  Other Topics Concern   Not on file  Social History Narrative   Not on file   Social Determinants of Health   Financial Resource Strain: Not on file  Food Insecurity: Not on file  Transportation Needs: Not on file  Physical Activity: Not on file  Stress: Not on file  Social Connections: Not on file  Intimate Partner Violence: Not on file    Review of Systems: See HPI, otherwise negative ROS  Physical Exam: BP (!) 165/98   Pulse 100   Temp 99.7 F (37.6 C) (Temporal)   Ht 5\' 8"  (1.727 m)    Wt 112.9 kg   SpO2 100%   BMI 37.86 kg/m  General:   Alert,  pleasant and cooperative in NAD Head:  Normocephalic and atraumatic. Neck:  Supple; no masses or thyromegaly. Lungs:  Clear throughout to auscultation.    Heart:  Regular rate and rhythm. Abdomen:  Soft, nontender and nondistended. Normal bowel sounds, without guarding, and without rebound.   Neurologic:  Alert and  oriented x4;  grossly normal neurologically.  Impression/Plan: [name] is here for an colonoscopy to be performed for colon cancer screening  Risks, benefits, limitations, and alternatives regarding  colonoscopy have been reviewed with the patient.  Questions have been answered.  All parties agreeable.   [name], MD  06/17/2021, 8:50 AM

## 2021-06-17 NOTE — Op Note (Signed)
[facility] Gastroenterology Patient Name: Dwayne James Procedure Date: 06/17/2021 9:35 AM MRN: [id number] Account #: [id number] Date of Birth: [birth year] Admit Type: Outpatient Age: 65 Room: [facility] OR ROOM 01 Gender: Male Note Status: Finalized Procedure:             Colonoscopy Indications:           Screening for colorectal malignant neoplasm, Last                         colonoscopy: August 2013 Providers:             Toney Reil MD, MD Referring MD:          Humberto Leep. Margarita Grizzle (Referring MD) Medicines:             General Anesthesia Complications:         No immediate complications. Estimated blood loss: None. Procedure:             Pre-Anesthesia Assessment:                        - Prior to the procedure, a History and Physical was                         performed, and patient medications and allergies were                         reviewed. The patient is competent. The risks and                         benefits of the procedure and the sedation options and                         risks were discussed with the patient. All questions                         were answered and informed consent was obtained.                         Patient identification and proposed procedure were                         verified by the physician, the nurse, the                         anesthesiologist, the anesthetist and the technician                         in the pre-procedure area in the procedure room in the                         endoscopy suite. Mental Status Examination: alert and                         oriented. Airway Examination: normal oropharyngeal                         airway and neck mobility. Respiratory Examination:  clear to auscultation. CV Examination: normal.                         Prophylactic Antibiotics: The patient does not require                         prophylactic antibiotics. Prior Anticoagulants: The                          patient has taken no previous anticoagulant or                         antiplatelet agents. ASA Grade Assessment: III - A                         patient with severe systemic disease. After reviewing                         the risks and benefits, the patient was deemed in                         satisfactory condition to undergo the procedure. The                         anesthesia plan was to use general anesthesia.                         Immediately prior to administration of medications,                         the patient was re-assessed for adequacy to receive                         sedatives. The heart rate, respiratory rate, oxygen                         saturations, blood pressure, adequacy of pulmonary                         ventilation, and response to care were monitored                         throughout the procedure. The physical status of the                         patient was re-assessed after the procedure.                        After obtaining informed consent, the colonoscope was                         passed under direct vision. Throughout the procedure,                         the patient's blood pressure, pulse, and oxygen                         saturations were monitored continuously. The  Colonoscope was introduced through the anus and                         advanced to the the cecum, identified by appendiceal                         orifice and ileocecal valve. The colonoscopy was                         performed without difficulty. The patient tolerated                         the procedure well. The quality of the bowel                         preparation was evaluated using the BBPS [facility] Bowel                         Preparation Scale) with scores of: Right Colon = 3,                         Transverse Colon = 3 and Left Colon = 3 (entire mucosa                         seen well with no residual staining, small  fragments                         of stool or opaque liquid). The total BBPS score                         equals 9. Findings:      Skin tags were found on perianal exam.      The entire examined colon appeared normal.      The retroflexed view of the distal rectum and anal verge was normal and       showed no anal or rectal abnormalities. Impression:            - Perianal skin tags found on perianal exam.                        - The entire examined colon is normal.                        - The distal rectum and anal verge are normal on                         retroflexion view.                        - No specimens collected. Recommendation:        - Discharge patient to home (with escort).                        - Resume previous diet today.                        - Continue present medications.                        -  Repeat colonoscopy in 10 years for screening                         purposes. Procedure Code(s):     --- Professional ---                        W9798, Colorectal cancer screening; colonoscopy on                         individual not meeting criteria for high risk Diagnosis Code(s):     --- Professional ---                        Z12.11, Encounter for screening for malignant neoplasm                         of colon                        K64.4, Residual hemorrhoidal skin tags CPT copyright 2019 American Medical Association. All rights reserved. The codes documented in this report are preliminary and upon coder review may  be revised to meet current compliance requirements. Dr. Libby Maw Toney Reil MD, MD 06/17/2021 10:01:47 AM This report has been signed electronically. Number of Addenda: 0 Note Initiated On: 06/17/2021 9:35 AM Scope Withdrawal Time: 0 hours 7 minutes 54 seconds  Total Procedure Duration: 0 hours 9 minutes 35 seconds  Estimated Blood Loss:  Estimated blood loss: none.      [facility]

## 2021-06-17 NOTE — Anesthesia Procedure Notes (Signed)
Date/Time: 06/17/2021 9:43 AM Performed by: Maree Krabbe, CRNA Pre-anesthesia Checklist: Patient identified, Emergency Drugs available, Suction available, Timeout performed and Patient being monitored Patient Re-evaluated:Patient Re-evaluated prior to induction Oxygen Delivery Method: Nasal cannula Placement Confirmation: positive ETCO2

## 2021-06-17 NOTE — Transfer of Care (Signed)
Immediate Anesthesia Transfer of Care Note  Patient: Dwayne James  Procedure(s) Performed: COLONOSCOPY WITH PROPOFOL (Rectum)  Patient Location: PACU  Anesthesia Type: General  Level of Consciousness: awake, alert  and patient cooperative  Airway and Oxygen Therapy: Patient Spontanous Breathing and Patient connected to supplemental oxygen  Post-op Assessment: Post-op Vital signs reviewed, Patient's Cardiovascular Status Stable, Respiratory Function Stable, Patent Airway and No signs of Nausea or vomiting  Post-op Vital Signs: Reviewed and stable  Complications: No notable events documented.

## 2021-06-18 ENCOUNTER — Encounter: Payer: Self-pay | Admitting: Gastroenterology

## 2021-08-04 ENCOUNTER — Other Ambulatory Visit: Payer: Self-pay

## 2021-08-04 ENCOUNTER — Encounter: Payer: Self-pay | Admitting: Gastroenterology

## 2021-08-04 ENCOUNTER — Ambulatory Visit (INDEPENDENT_AMBULATORY_CARE_PROVIDER_SITE_OTHER): Payer: Medicare Other | Admitting: Gastroenterology

## 2021-08-04 VITALS — BP 128/86 | HR 89 | Temp 98.7°F | Wt 247.2 lb

## 2021-08-04 DIAGNOSIS — G8929 Other chronic pain: Secondary | ICD-10-CM

## 2021-08-04 DIAGNOSIS — K219 Gastro-esophageal reflux disease without esophagitis: Secondary | ICD-10-CM | POA: Diagnosis not present

## 2021-08-04 DIAGNOSIS — R1013 Epigastric pain: Secondary | ICD-10-CM

## 2021-08-04 MED ORDER — RABEPRAZOLE SODIUM 20 MG PO TBEC: 20.0000 mg | DELAYED_RELEASE_TABLET | Freq: Two times a day (BID) | ORAL | 2 refills | Status: DC

## 2021-08-04 NOTE — Patient Instructions (Signed)
Gastroesophageal Reflux Disease, Adult Gastroesophageal reflux (GER) happens when acid from the stomach flows up into the tube that connects the mouth and the stomach (esophagus). Normally, food travels down the esophagus and stays in the stomach to be digested. With GER, food and stomach acid sometimes move back up into the esophagus. You may have a disease called gastroesophageal reflux disease (GERD) if the reflux: Happens often. Causes frequent or very bad symptoms. Causes problems such as damage to the esophagus. When this happens, the esophagus becomes sore and swollen. Over time, GERD can make small holes (ulcers) in the lining of the esophagus. What are the causes? This condition is caused by a problem with the muscle between the esophagus and the stomach. When this muscle is weak or not normal, it does not close properly to keep food and acid from coming back up from the stomach. The muscle can be weak because of: Tobacco use. Pregnancy. Having a certain type of hernia (hiatal hernia). Alcohol use. Certain foods and drinks, such as coffee, chocolate, onions, and peppermint. What increases the risk? Being overweight. Having a disease that affects your connective tissue. Taking NSAIDs, such a ibuprofen. What are the signs or symptoms? Heartburn. Difficult or painful swallowing. The feeling of having a lump in the throat. A bitter taste in the mouth. Bad breath. Having a lot of saliva. Having an upset or bloated stomach. Burping. Chest pain. Different conditions can cause chest pain. Make sure you see your doctor if you have chest pain. Shortness of breath or wheezing. A long-term cough or a cough at night. Wearing away of the surface of teeth (tooth enamel). Weight loss. How is this treated? Making changes to your diet. Taking medicine. Having surgery. Treatment will depend on how bad your symptoms are. Follow these instructions at home: Eating and drinking  Follow a  diet as told by your doctor. You may need to avoid foods and drinks such as: Coffee and tea, with or without caffeine. Drinks that contain alcohol. Energy drinks and sports drinks. Bubbly (carbonated) drinks or sodas. Chocolate and cocoa. Peppermint and mint flavorings. Garlic and onions. Horseradish. Spicy and acidic foods. These include peppers, chili powder, curry powder, vinegar, hot sauces, and BBQ sauce. Citrus fruit juices and citrus fruits, such as oranges, lemons, and limes. Tomato-based foods. These include red sauce, chili, salsa, and pizza with red sauce. Fried and fatty foods. These include donuts, french fries, potato chips, and high-fat dressings. High-fat meats. These include hot dogs, rib eye steak, sausage, ham, and bacon. High-fat dairy items, such as whole milk, butter, and cream cheese. Eat small meals often. Avoid eating large meals. Avoid drinking large amounts of liquid with your meals. Avoid eating meals during the 2-3 hours before bedtime. Avoid lying down right after you eat. Do not exercise right after you eat. Lifestyle  Do not smoke or use any products that contain nicotine or tobacco. If you need help quitting, ask your doctor. Try to lower your stress. If you need help doing this, ask your doctor. If you are overweight, lose an amount of weight that is healthy for you. Ask your doctor about a safe weight loss goal. General instructions Pay attention to any changes in your symptoms. Take over-the-counter and prescription medicines only as told by your doctor. Do not take aspirin, ibuprofen, or other NSAIDs unless your doctor says it is okay. Wear loose clothes. Do not wear anything tight around your waist. Raise (elevate) the head of your bed about 6   inches (15 cm). You may need to use a wedge to do this. Avoid bending over if this makes your symptoms worse. Keep all follow-up visits. Contact a doctor if: You have new symptoms. You lose weight and you  do not know why. You have trouble swallowing or it hurts to swallow. You have wheezing or a cough that keeps happening. You have a hoarse voice. Your symptoms do not get better with treatment. Get help right away if: You have sudden pain in your arms, neck, jaw, teeth, or back. You suddenly feel sweaty, dizzy, or light-headed. You have chest pain or shortness of breath. You vomit and the vomit is green, yellow, or black, or it looks like blood or coffee grounds. You faint. Your poop (stool) is red, bloody, or black. You cannot swallow, drink, or eat. These symptoms may represent a serious problem that is an emergency. Do not wait to see if the symptoms will go away. Get medical help right away. Call your local emergency services (911 in the [place] Do not drive yourself to the hospital. Summary If a person has gastroesophageal reflux disease (GERD), food and stomach acid move back up into the esophagus and cause symptoms or problems such as damage to the esophagus. Treatment will depend on how bad your symptoms are. Follow a diet as told by your doctor. Take all medicines only as told by your doctor. This information is not intended to replace advice given to you by your health care provider. Make sure you discuss any questions you have with your health care provider. Document Revised: 04/27/2020 Document Reviewed: 04/27/2020 Elsevier Patient Education  2022 [employer]  

## 2021-08-04 NOTE — Progress Notes (Signed)
Arlyss Repress, MD [address]  [address]  [place], [place] [address]  Main: [phone]  Fax: [phone]    Gastroenterology Consultation  Referring Provider:     Racheal Patches, MD Primary Care Physician:  Racheal Patches, MD Primary Gastroenterologist:  Dr. Arlyss Repress Reason for Consultation: Epigastric pain        HPI:   Dwayne James is a 65 y.o. male referred by Dr. Kate Sable, II Ellin Mayhew, MD  for consultation & management of epigastric pain.  Patient reports that for more than a year, he has been experiencing intermittent episodes of severe epigastric pain burning in nature associated with nausea.  The pain usually last for 2 hours, he has been taking Aciphex 20 mg for at least 5 years.  He has history of Nissen's fundoplication in 2006, has been off acid suppressive therapy for 10 years approximately.  He is worried about a slipped fundoplication and want to know if he needs redo surgery.  He does acknowledge drinking alcohol 4 to 5 days a week, consumes beer or wine or hard liquor.  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  Patient is retired.  He does consume dairy and meat regularly Review of labs revealed normal hemoglobin, macrocytosis, normal PT/INR, LFTs were normal.  Patient had history of PE, underwent thrombectomy, was on anticoagulation for 1 year in 2020.  History of paroxysmal A. fib.  Patient is closely followed by cardiology who recommended him to continue Xarelto 20 mg p.o. daily long-term given unprovoked PE however he was not able to afford it. In the meantime, he was started on aspirin 81 mg p.o. daily.  NSAIDs: None  Antiplts/Anticoagulants/Anti thrombotics: None  GI Procedures:  Colonoscopy 06/17/2021 - Perianal skin tags found on perianal exam. - The entire examined colon is normal. - The distal rectum and anal verge are normal on  retroflexion view. - No specimens collected.  Past Medical History:  Diagnosis Date   Atrial fibrillation (HCC)    COPD (chronic obstructive pulmonary disease) (HCC)    GERD (gastroesophageal reflux disease)    Prediabetes    Seizure (HCC) 2009   x 1, pt reports it was related to an antidepressant he was taking    Past Surgical History:  Procedure Laterality Date   ANKLE ARTHROSCOPY Left    ATRIAL FIBRILLATION ABLATION  04/2020   CARDIOVERSION     x 2   COLONOSCOPY WITH PROPOFOL N/A 06/17/2021   Procedure: COLONOSCOPY WITH PROPOFOL;  Surgeon: Toney Reil, MD;  Location: [facility];  Service: Endoscopy;  Laterality: N/A;   KNEE ARTHROSCOPY Right 2009   NISSEN FUNDOPLICATION     PULMONARY THROMBECTOMY N/A 10/16/2018   Procedure: PULMONARY THROMBECTOMY;  Surgeon: Renford Dills, MD;  Location: [facility];  Service: Cardiovascular;  Laterality: N/A;    Current Outpatient Medications:    amphetamine-dextroamphetamine (ADDERALL) 20 MG tablet, Take 20 mg by mouth daily., Disp: , Rfl:    ARIPiprazole (ABILIFY) 15 MG tablet, Take 15 mg by mouth daily., Disp: , Rfl:    aspirin EC 81 MG tablet, Take 81 mg by mouth daily. Swallow whole., Disp: , Rfl:    DULoxetine (CYMBALTA) 60 MG capsule, Take 60 mg by mouth 2 (two) times daily., Disp: , Rfl:    ezetimibe (ZETIA) 10 MG tablet, Take 10 mg by mouth daily., Disp: ,  Rfl:    furosemide (LASIX) 20 MG tablet, Take 20 mg by mouth daily., Disp: , Rfl:    ketoconazole (NIZORAL) 2 % shampoo, Apply topically., Disp: , Rfl:    losartan (COZAAR) 50 MG tablet, Take 50 mg by mouth 2 (two) times daily., Disp: , Rfl:    metoprolol tartrate (LOPRESSOR) 25 MG tablet, Take 25 mg by mouth 2 (two) times daily., Disp: , Rfl:    SEMAGLUTIDE-WEIGHT MANAGEMENT [place], Inject into the skin once a week., Disp: , Rfl:    testosterone enanthate (DELATESTRYL) 200 MG/ML injection, Inject into the muscle every 7 (seven) days. For IM use only,  Disp: , Rfl:    RABEprazole (ACIPHEX) 20 MG tablet, Take 1 tablet (20 mg total) by mouth 2 (two) times daily before a meal., Disp: 60 tablet, Rfl: 2    Family History  Problem Relation Age of Onset   Heart disease Father    COPD Father    Emphysema Father    Stroke Father      Social History   Tobacco Use   Smoking status: Former    Types: Cigarettes    Quit date: 05/28/2004    Years since quitting: 17.1   Smokeless tobacco: Never  Substance Use Topics   Alcohol use: Yes    Comment: 2-3 drinks 4 days a week (liquor, beer, & wine)   Drug use: No    Allergies as of 08/04/2021   (No Known Allergies)    Review of Systems:    All systems reviewed and negative except where noted in HPI.   Physical Exam:  BP 128/86 (BP Location: Left Arm, Patient Position: Sitting, Cuff Size: Normal)   Pulse 89   Temp 98.7 F (37.1 C) (Oral)   Wt 247 lb 3.2 oz (112.1 kg)   BMI 37.59 kg/m  No LMP for male patient.  General:   Alert,  Well-developed, well-nourished, pleasant and cooperative in NAD Head:  Normocephalic and atraumatic. Eyes:  Sclera clear, no icterus.   Conjunctiva pink. Ears:  Normal auditory acuity. Nose:  No deformity, discharge, or lesions. Mouth:  No deformity or lesions,oropharynx pink & moist. Neck:  Supple; no masses or thyromegaly. Lungs:  Respirations even and unlabored.  Clear throughout to auscultation.   No wheezes, crackles, or rhonchi. No acute distress. Heart:  Regular rate and rhythm; no murmurs, clicks, rubs, or gallops. Abdomen:  Normal bowel sounds. Soft, epigastric tenderness and non-distended without masses, hepatosplenomegaly or hernias noted.  No guarding or rebound tenderness.   Rectal: Not performed Msk:  Symmetrical without gross deformities. Good, equal movement & strength bilaterally. Pulses:  Normal pulses noted. Extremities:  No clubbing or edema.  No cyanosis. Neurologic:  Alert and oriented x3;  grossly normal neurologically. Skin:   Intact without significant lesions or rashes. No jaundice. Psych:  Alert and cooperative. Normal mood and affect.  Imaging Studies: Reviewed  Assessment and Plan:   Yordi Krager is a 65 y.o. male with obesity, history of A. fib, history of PE, s/p thrombectomy, was on Xarelto for 1 year, metabolic syndrome, history of chronic GERD, status post Nissen's fundoplication in 2006 is seen in consultation for chronic intermittent episodes of epigastric pain, chronic alcohol use  Differentials include erosive esophagitis or peptic ulcer disease or alcohol induced gastritis or biliary colic or alcohol induced pancreatitis attacks Increase Aciphex to 20 mg p.o. twice daily before meals Strongly advised him to cut back on alcohol use.  Patient is closely working with psychiatrist to  get his depression under control Recommend EGD for further evaluation If EGD is unremarkable, recommend abdominal ultrasound or a CT abdomen pelvis with contrast   Follow up in 4 months   Arlyss Repress, MD

## 2021-08-11 ENCOUNTER — Ambulatory Visit
Admission: RE | Admit: 2021-08-11 | Discharge: 2021-08-11 | Disposition: A | Discharge status: Home / Self Care | Payer: Medicare Other | Attending: Gastroenterology | Admitting: Gastroenterology

## 2021-08-11 ENCOUNTER — Ambulatory Visit: Payer: Medicare Other | Admitting: Certified Registered"

## 2021-08-11 ENCOUNTER — Encounter
Admission: RE | Disposition: A | Discharge status: Home / Self Care | Payer: Self-pay | Source: Home / Self Care | Attending: Gastroenterology

## 2021-08-11 ENCOUNTER — Encounter: Payer: Self-pay | Admitting: Gastroenterology

## 2021-08-11 DIAGNOSIS — K219 Gastro-esophageal reflux disease without esophagitis: Secondary | ICD-10-CM | POA: Diagnosis not present

## 2021-08-11 DIAGNOSIS — Z87891 Personal history of nicotine dependence: Secondary | ICD-10-CM | POA: Insufficient documentation

## 2021-08-11 DIAGNOSIS — K319 Disease of stomach and duodenum, unspecified: Secondary | ICD-10-CM | POA: Diagnosis not present

## 2021-08-11 DIAGNOSIS — Z86711 Personal history of pulmonary embolism: Secondary | ICD-10-CM | POA: Insufficient documentation

## 2021-08-11 DIAGNOSIS — K259 Gastric ulcer, unspecified as acute or chronic, without hemorrhage or perforation: Secondary | ICD-10-CM | POA: Diagnosis not present

## 2021-08-11 DIAGNOSIS — G8929 Other chronic pain: Secondary | ICD-10-CM

## 2021-08-11 DIAGNOSIS — R1013 Epigastric pain: Secondary | ICD-10-CM | POA: Insufficient documentation

## 2021-08-11 DIAGNOSIS — Z9889 Other specified postprocedural states: Secondary | ICD-10-CM | POA: Insufficient documentation

## 2021-08-11 DIAGNOSIS — Z7982 Long term (current) use of aspirin: Secondary | ICD-10-CM | POA: Diagnosis not present

## 2021-08-11 DIAGNOSIS — Z79899 Other long term (current) drug therapy: Secondary | ICD-10-CM | POA: Diagnosis not present

## 2021-08-11 HISTORY — PX: ESOPHAGOGASTRODUODENOSCOPY (EGD) WITH PROPOFOL: SHX5813

## 2021-08-11 SURGERY — ESOPHAGOGASTRODUODENOSCOPY (EGD) WITH PROPOFOL
Anesthesia: General

## 2021-08-11 MED ORDER — LIDOCAINE HCL (CARDIAC) PF 100 MG/5ML IV SOSY
PREFILLED_SYRINGE | INTRAVENOUS | Status: DC | PRN
  Administered 2021-08-11: 80 mg via INTRAVENOUS

## 2021-08-11 MED ORDER — PROPOFOL 500 MG/50ML IV EMUL
INTRAVENOUS | Status: DC | PRN
  Administered 2021-08-11: 130 ug/kg/min via INTRAVENOUS

## 2021-08-11 MED ORDER — PROPOFOL 10 MG/ML IV BOLUS
INTRAVENOUS | Status: DC | PRN
  Administered 2021-08-11: 70 mg via INTRAVENOUS

## 2021-08-11 MED ORDER — SODIUM CHLORIDE 0.9 % IV SOLN: INTRAVENOUS | Status: DC

## 2021-08-11 NOTE — Op Note (Signed)
[facility] Patient Name: Dwayne James Procedure Date: 08/11/2021 11:12 AM MRN: [id number] Account #: [id number] Date of Birth: 1956-04-05 Admit Type: Outpatient Age: 65 Room: [facility] ENDO ROOM 4 Gender: Male Note Status: Finalized Instrument Name: Upper Endoscope [id number] Procedure:             Upper GI endoscopy Indications:           Epigastric abdominal pain Providers:             Toney Reil MD, MD Referring MD:          Sherolyn Buba. Ellin Mayhew, MD Medicines:             General Anesthesia Complications:         No immediate complications. Estimated blood loss: None. Procedure:             Pre-Anesthesia Assessment:                        - Prior to the procedure, a History and Physical was                         performed, and patient medications and allergies were                         reviewed. The patient is competent. The risks and                         benefits of the procedure and the sedation options and                         risks were discussed with the patient. All questions                         were answered and informed consent was obtained.                         Patient identification and proposed procedure were                         verified by the physician, the nurse, the                         anesthesiologist, the anesthetist and the technician                         in the pre-procedure area in the procedure room in the                         endoscopy suite. Mental Status Examination: alert and                         oriented. Airway Examination: normal oropharyngeal                         airway and neck mobility. Respiratory Examination:                         clear to auscultation. CV Examination: normal.  Prophylactic Antibiotics: The patient does not require                         prophylactic antibiotics. Prior Anticoagulants: The                         patient has taken  no previous anticoagulant or                         antiplatelet agents. ASA Grade Assessment: III - A                         patient with severe systemic disease. After reviewing                         the risks and benefits, the patient was deemed in                         satisfactory condition to undergo the procedure. The                         anesthesia plan was to use general anesthesia.                         Immediately prior to administration of medications,                         the patient was re-assessed for adequacy to receive                         sedatives. The heart rate, respiratory rate, oxygen                         saturations, blood pressure, adequacy of pulmonary                         ventilation, and response to care were monitored                         throughout the procedure. The physical status of the                         patient was re-assessed after the procedure.                        After obtaining informed consent, the endoscope was                         passed under direct vision. Throughout the procedure,                         the patient's blood pressure, pulse, and oxygen                         saturations were monitored continuously. The Endoscope                         was introduced through the mouth, and advanced to the  second part of duodenum. The upper GI endoscopy was                         accomplished without difficulty. The patient tolerated                         the procedure well. Findings:      The duodenal bulb and second portion of the duodenum were normal.      Multiple localized large erosions with no bleeding and no stigmata of       recent bleeding were found in the gastric body. Biopsies were taken with       a cold forceps for Helicobacter pylori testing.      The incisura and gastric antrum were normal. Biopsies were taken with a       cold forceps for Helicobacter pylori  testing.      Evidence of a Nissen fundoplication was found in the cardia. The wrap       appeared loose. This was traversed.      The gastroesophageal junction and examined esophagus were normal.      Esophagogastric landmarks were identified: the gastroesophageal junction       was found at 40 cm from the incisors. Impression:            - Normal duodenal bulb and second portion of the                         duodenum.                        - Erosive gastropathy with no bleeding and no stigmata                         of recent bleeding. Biopsied.                        - Normal incisura and antrum. Biopsied.                        - A Nissen fundoplication was found. The wrap appears                         loose.                        - Normal gastroesophageal junction and esophagus.                        - Esophagogastric landmarks identified. Recommendation:        - Discharge patient to home (with escort).                        - Resume previous diet today.                        - Continue present medications.                        - Await pathology results.                        - Follow an antireflux regimen  indefinitely.                        - Use a proton pump inhibitor PO BID for 3 months.                        - Return to my office as previously scheduled. Procedure Code(s):     --- Professional ---                        [phone], Esophagogastroduodenoscopy, flexible,                         transoral; with biopsy, single or multiple Diagnosis Code(s):     --- Professional ---                        K31.89, Other diseases of stomach and duodenum                        Z98.890, Other specified postprocedural states                        R10.13, Epigastric pain CPT copyright 2019 American Medical Association. All rights reserved. The codes documented in this report are preliminary and upon coder review may  be revised to meet current compliance requirements. Dr.  Libby Maw Toney Reil MD, MD 08/11/2021 11:35:44 AM This report has been signed electronically. Number of Addenda: 0 Note Initiated On: 08/11/2021 11:12 AM Estimated Blood Loss:  Estimated blood loss: none.      [facility]

## 2021-08-11 NOTE — H&P (Signed)
Arlyss Repress, MD [address]  [address]  [place], [place] [address]  Main: [phone]  Fax: [phone] Pager: [phone]  Primary Care Physician:  Racheal Patches, MD Primary Gastroenterologist:  Dr. Arlyss Repress  Pre-Procedure History & Physical: HPI:  Dwayne James is a 65 y.o. male is here for an endoscopy.   Past Medical History:  Diagnosis Date   Atrial fibrillation (HCC)    COPD (chronic obstructive pulmonary disease) (HCC)    GERD (gastroesophageal reflux disease)    Prediabetes    Seizure (HCC) 2009   x 1, pt reports it was related to an antidepressant he was taking    Past Surgical History:  Procedure Laterality Date   ANKLE ARTHROSCOPY Left    ATRIAL FIBRILLATION ABLATION  04/2020   CARDIOVERSION     x 2   COLONOSCOPY WITH PROPOFOL N/A 06/17/2021   Procedure: COLONOSCOPY WITH PROPOFOL;  Surgeon: Toney Reil, MD;  Location: [facility];  Service: Endoscopy;  Laterality: N/A;   KNEE ARTHROSCOPY Right 2009   NISSEN FUNDOPLICATION     PULMONARY THROMBECTOMY N/A 10/16/2018   Procedure: PULMONARY THROMBECTOMY;  Surgeon: Renford Dills, MD;  Location: [facility];  Service: Cardiovascular;  Laterality: N/A;    Prior to Admission medications   Medication Sig Start Date End Date Taking? Authorizing Provider  amphetamine-dextroamphetamine (ADDERALL) 20 MG tablet Take 20 mg by mouth daily.    [provider]  ARIPiprazole (ABILIFY) 15 MG tablet Take 15 mg by mouth daily.    [provider]  aspirin EC 81 MG tablet Take 81 mg by mouth daily. Swallow whole.    [provider]  DULoxetine (CYMBALTA) 60 MG capsule Take 60 mg by mouth 2 (two) times daily.    [provider]  ezetimibe (ZETIA) 10 MG tablet Take 10 mg by mouth daily.    [provider]  furosemide (LASIX) 20 MG tablet Take 20 mg by mouth daily. 02/24/21   [provider]  ketoconazole (NIZORAL) 2 %  shampoo Apply topically. 04/20/21   [provider]  losartan (COZAAR) 50 MG tablet Take 50 mg by mouth 2 (two) times daily.    [provider]  metoprolol tartrate (LOPRESSOR) 25 MG tablet Take 25 mg by mouth 2 (two) times daily.    [provider]  RABEprazole (ACIPHEX) 20 MG tablet Take 1 tablet (20 mg total) by mouth 2 (two) times daily before a meal. 08/04/21 09/03/21  Adriahna Shearman, Loel Dubonnet, MD  [facility] MANAGEMENT [place] Inject into the skin once a week.    [provider]  testosterone enanthate (DELATESTRYL) 200 MG/ML injection Inject into the muscle every 7 (seven) days. For IM use only    [provider]    Allergies as of 08/04/2021   (No Known Allergies)    Family History  Problem Relation Age of Onset   Heart disease Father    COPD Father    Emphysema Father    Stroke Father     Social History   Socioeconomic History   Marital status: Widowed    Spouse name: Not on file   Number of children: Not on file   Years of education: Not on file   Highest education level: Not on file  Occupational History   Not on file  Tobacco Use   Smoking status: Former    Types: Cigarettes    Quit date: 05/28/2004    Years since quitting: 17.2  Smokeless tobacco: Current    Types: Chew  Vaping Use   Vaping Use: Never used  Substance and Sexual Activity   Alcohol use: Yes    Comment: 2-3 drinks 4 days a week (liquor, beer, & wine)   Drug use: No   Sexual activity: Not on file  Other Topics Concern   Not on file  Social History Narrative   Not on file   Social Determinants of Health   Financial Resource Strain: Not on file  Food Insecurity: Not on file  Transportation Needs: Not on file  Physical Activity: Not on file  Stress: Not on file  Social Connections: Not on file  Intimate Partner Violence: Not on file    Review of Systems: See HPI, otherwise negative ROS  Physical Exam: BP (!) 141/104   Pulse 90   Temp (!)  97.5 F (36.4 C) (Temporal)   Resp 18   Ht 5\' 8"  (1.727 m)   Wt 109.8 kg   SpO2 99%   BMI 36.80 kg/m  General:   Alert,  pleasant and cooperative in NAD Head:  Normocephalic and atraumatic. Neck:  Supple; no masses or thyromegaly. Lungs:  Clear throughout to auscultation.    Heart:  Regular rate and rhythm. Abdomen:  Soft, nontender and nondistended. Normal bowel sounds, without guarding, and without rebound.   Neurologic:  Alert and  oriented x4;  grossly normal neurologically.  Impression/Plan: Dwayne James is here for an endoscopy to be performed for epigastric pain  Risks, benefits, limitations, and alternatives regarding  endoscopy have been reviewed with the patient.  Questions have been answered.  All parties agreeable.   [name], MD  08/11/2021, 11:15 AM

## 2021-08-11 NOTE — Anesthesia Postprocedure Evaluation (Signed)
Anesthesia Post Note  Patient: Dwayne James  Procedure(s) Performed: ESOPHAGOGASTRODUODENOSCOPY (EGD) WITH PROPOFOL  Patient location during evaluation: PACU Anesthesia Type: General Level of consciousness: awake and alert, oriented and patient cooperative Pain management: pain level controlled Vital Signs Assessment: post-procedure vital signs reviewed and stable Respiratory status: spontaneous breathing, nonlabored ventilation and respiratory function stable Cardiovascular status: blood pressure returned to baseline and stable Postop Assessment: adequate PO intake Anesthetic complications: no   No notable events documented.   Last Vitals:  Vitals:   08/11/21 1130 08/11/21 1140  BP: 99/67 125/76  Pulse: 92 90  Resp: (!) 23 (!) 24  Temp: (!) 36.4 C   SpO2: 99% 97%    Last Pain:  Vitals:   08/11/21 1130  TempSrc: Temporal  PainSc:                  Reed Breech

## 2021-08-11 NOTE — Transfer of Care (Signed)
Immediate Anesthesia Transfer of Care Note  Patient: Dwayne James  Procedure(s) Performed: ESOPHAGOGASTRODUODENOSCOPY (EGD) WITH PROPOFOL  Patient Location: PACU and Endoscopy Unit  Anesthesia Type:General  Level of Consciousness: drowsy  Airway & Oxygen Therapy: Patient Spontanous Breathing  Post-op Assessment: Report given to RN  Post vital signs: stable  Last Vitals:  Vitals Value Taken Time  BP    Temp    Pulse    Resp    SpO2      Last Pain:  Vitals:   08/11/21 1038  TempSrc: Temporal  PainSc: 0-No pain         Complications: No notable events documented.

## 2021-08-11 NOTE — Anesthesia Preprocedure Evaluation (Addendum)
Anesthesia Evaluation  Patient identified by MRN, date of birth, ID band Patient awake    Reviewed: Allergy & Precautions, H&P , NPO status , Patient's Chart, lab work & pertinent test results  Airway Mallampati: I   Neck ROM: full    Dental   Missing upper teeth x2:   Pulmonary COPD, former smoker,    Pulmonary exam normal breath sounds clear to auscultation       Cardiovascular Exercise Tolerance: Good hypertension, +CHF (EF 40%)  negative cardio ROS Normal cardiovascular exam+ dysrhythmias (a fib s/p ablation)  Rhythm:Regular Rate:Normal  Hx PE  EKG 05/13/21: normal sinus rhythm with frequent PVCs, PACs, 98 bpm, normal QRS, QTC 459 ms, no significant ST-T wave changes.  TEE 12/25/19: normal atrial size and no thrombus. LV EF 40% with global hypokinesis.  Nuclear stress test 12/25/19: Small fixed apical defect likely artifact. Noischemia. EF 40% with globally.   Neuro/Psych Seizures - (x1, medication-related),  PSYCHIATRIC DISORDERS Depression negative psych ROS   GI/Hepatic GERD (s/p Nissen)  ,  Endo/Other  negative endocrine ROSObesity   Renal/GU negative Renal ROS  negative genitourinary   Musculoskeletal   Abdominal   Peds  Hematology negative hematology ROS (+)   Anesthesia Other Findings Cardiology note 05/13/21:  Preoperative cardiovascular risk assessment:  Patient plans to undergo colonoscopy soon. He has history of paroxysmal atrial fibrillation s/p successful ablation on 04/2020, mild systolic dysfunction-felt tachycardia mediated, and uncontrolled hypertension. He was maintaining normal sinus rhythm during my recent evaluation as detailed below. Has remained fairly stable from cardiac standpoint however blood pressure was significantly elevated prompting escalation of antihypertensive regimen. He will be at low risk for MACE undergoing low risk procedure at this time.  -He was recommended to continue  Xarelto 20 mg p.o. daily long-term given unprovoked PE however he was not able to afford it. In the meantime, he was started on aspirin 81 mg p.o. daily. -He can hold aspirin 5 to 7 days prior to the upcoming procedure. Restart soon after as appropriate. -Continue current beta-blocker therapy, other hypertensive regimen perioperatively. Prefer to keep blood pressure less than 130/80 mmHg.  Patient PCP: [name], MD  Assessment and Plan Recommendations:  Patient with uncontrolled hypertension. Home blood pressure averages around 145/100s. Has occasional low blood pressure in 100-120 systolic with associated symptoms of lightheadedness. No syncope.  -Orthostatic vital signs negative. Has appropriate blood pressure response with position change. -Increase losartan 50 mg p.o. twice daily, metoprolol succinate 25 mg p.o. twice daily for better blood pressure control. -Monitor blood pressure regularly and notify us if it remains elevated. -Continue to follow 2 g sodium restricted diet, increase activity/exercise.  Has paroxysmal atrial fibrillation s/p successful ablation 05/05/2020. No recurrent PAF since then. -EKG today reveals normal sinus rhythm with frequent PACs and PVCs. -Continue metoprolol succinate as above for backup rate control as well as ectopy suppression. -He discontinued Xarelto about a month ago given high cost. Long-term Xarelto was recommended given unprovoked PE. -Agrees to start aspirin 81 mg p.o. daily in the meantime.  Has mild systolic dysfunction with EF 40% per echo on 12/25/2019, felt to be tachycardia mediated. -He denies any overt heart failure symptoms. Weight somewhat stable. -Continue losartan, metoprolol as above. -Has not required any as needed Lasix recently. -Continue dietary restriction as above.  -Continue ezetimibe at current dose to keep goal LDL less than 100 minute/dL, preferably less than 70 mg/dL. -Follow heart healthy diet, regular exercise to  help lose weight. -Reportedly had blood  work done about a month ago at PCPs office and was told everything looked fine. We will request records.  Patient is obese but denies any snoring. Continue working on diet and exercise to help lose weight.  Has been battling with depression for last 6 to 7 years. In the depression changed to Cymbalta and Abilify recently. Plans to start counseling therapy soon.  Follow Up In 3-4 months, or sooner if needed.   Reproductive/Obstetrics negative OB ROS                            Anesthesia Physical  Anesthesia Plan  ASA: 3  Anesthesia Plan: General   Post-op Pain Management:    Induction: Intravenous  PONV Risk Score and Plan: 2 and Propofol infusion, Treatment may vary due to age or medical condition and TIVA  Airway Management Planned: Natural Airway  Additional Equipment:   Intra-op Plan:   Post-operative Plan:   Informed Consent: I have reviewed the patients History and Physical, chart, labs and discussed the procedure including the risks, benefits and alternatives for the proposed anesthesia with the patient or authorized representative who has indicated his/her understanding and acceptance.       Plan Discussed with: CRNA  Anesthesia Plan Comments:        Anesthesia Quick Evaluation

## 2021-08-12 ENCOUNTER — Encounter: Payer: Self-pay | Admitting: Gastroenterology

## 2021-08-12 ENCOUNTER — Telehealth: Payer: Self-pay

## 2021-08-12 LAB — SURGICAL PATHOLOGY

## 2021-08-12 NOTE — Telephone Encounter (Signed)
CALLED PATIENT HE UNDERSTANDS RESULTS AND HIS INSTRUCTIONS ON HIS MEDICINE

## 2021-12-07 ENCOUNTER — Other Ambulatory Visit: Payer: Self-pay

## 2021-12-08 ENCOUNTER — Ambulatory Visit (INDEPENDENT_AMBULATORY_CARE_PROVIDER_SITE_OTHER): Payer: Medicare Other | Admitting: Gastroenterology

## 2021-12-08 ENCOUNTER — Encounter: Payer: Self-pay | Admitting: Gastroenterology

## 2021-12-08 ENCOUNTER — Other Ambulatory Visit: Payer: Self-pay

## 2021-12-08 VITALS — BP 127/85 | HR 109 | Temp 98.7°F | Ht 68.0 in | Wt 244.2 lb

## 2021-12-08 DIAGNOSIS — K219 Gastro-esophageal reflux disease without esophagitis: Secondary | ICD-10-CM

## 2021-12-08 DIAGNOSIS — G8929 Other chronic pain: Secondary | ICD-10-CM | POA: Diagnosis not present

## 2021-12-08 DIAGNOSIS — R1013 Epigastric pain: Secondary | ICD-10-CM | POA: Diagnosis not present

## 2021-12-08 DIAGNOSIS — Z9889 Other specified postprocedural states: Secondary | ICD-10-CM

## 2021-12-08 NOTE — Progress Notes (Signed)
Sent over referral to dr Bea Graff pabon

## 2021-12-08 NOTE — Progress Notes (Signed)
Arlyss Repress, MD [address]  [address]  [place], [place] [address]  Main: [phone]  Fax: [phone]    Gastroenterology Consultation  Referring Provider:     Racheal Patches, MD Primary Care Physician:  Racheal Patches, MD Primary Gastroenterologist:  Dr. Arlyss Repress Reason for Consultation: Epigastric pain        HPI:   Dwayne James is a 66 y.o. male referred by Dr. Kate Sable, II Ellin Mayhew, MD  for consultation & management of epigastric pain.  Patient reports that for more than a year, he has been experiencing intermittent episodes of severe epigastric pain burning in nature associated with nausea.  The pain usually last for 2 hours, he has been taking Aciphex 20 mg for at least 5 years.  He has history of Nissen's fundoplication in 2006, has been off acid suppressive therapy for 10 years approximately.  He is worried about a slipped fundoplication and want to know if he needs redo surgery.  He does acknowledge drinking alcohol 4 to 5 days a week, consumes beer or wine or hard liquor.  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  Patient is retired.  He does consume dairy and meat regularly Review of labs revealed normal hemoglobin, macrocytosis, normal PT/INR, LFTs were normal.  Patient had history of PE, underwent thrombectomy, was on anticoagulation for 1 year in 2020.  History of paroxysmal A. fib.  Patient is closely followed by cardiology who recommended him to continue Xarelto 20 mg p.o. daily long-term given unprovoked PE however he was not able to afford it. In the meantime, he was started on aspirin 81 mg p.o. daily.  Follow-up visit 12/08/2021 Patient is here to discuss about redo fundoplication.  He underwent upper endoscopy which revealed slightly loose wrap from previous fundoplication.  He is concerned about ongoing episodes of flareup of reflux.  He  reports that the episodes are not triggered after eating.  They have occurred on empty stomach.  Without any radiation to the back or shoulder.  He denies symptoms of nausea or vomiting.  He does consume deer meat regularly.  He continues to take Aciphex 20 mg p.o. twice daily.  He reports having 2 episodes of severe epigastric pain since last visit.  He also reports nocturnal regurgitation and heartburn if he does not follow antireflux lifestyle.  He is very interested to undergo redo fundoplication and stop PPI.  NSAIDs: None  Antiplts/Anticoagulants/Anti thrombotics: None  GI Procedures:  Upper endoscopy 08/11/2021 - Normal duodenal bulb and second portion of the duodenum. - Erosive gastropathy with no bleeding and no stigmata of recent bleeding. Biopsied. - Normal incisura and antrum. Biopsied. - A Nissen fundoplication was found. The wrap appears loose. - Normal gastroesophageal junction and esophagus. - Esophagogastric landmarks identified. DIAGNOSIS:  A. STOMACH, RANDOM; COLD BIOPSY:  - GASTRIC ANTRAL MUCOSA WITH MILD REACTIVE GASTROPATHY AND SUPERFICIAL  VASCULAR CONGESTION.  - GASTRIC OXYNTIC MUCOSA WITH FEATURES OF MILD PPI EFFECT.  - NEGATIVE FOR H. PYLORI, DYSPLASIA, AND MALIGNANCY.   Colonoscopy 06/17/2021 - Perianal skin tags found on perianal exam. - The entire examined colon is normal. - The distal rectum and anal verge are normal on retroflexion view. - No specimens collected.  Past Medical History:  Diagnosis Date   Atrial fibrillation (HCC)    COPD (chronic obstructive pulmonary disease) (HCC)    GERD (gastroesophageal  reflux disease)    Prediabetes    Seizure (HCC) 2009   x 1, pt reports it was related to an antidepressant he was taking    Past Surgical History:  Procedure Laterality Date   ANKLE ARTHROSCOPY Left    ATRIAL FIBRILLATION ABLATION  04/2020   CARDIOVERSION     x 2   COLONOSCOPY WITH PROPOFOL N/A 06/17/2021   Procedure: COLONOSCOPY WITH  PROPOFOL;  Surgeon: Toney Reil, MD;  Location: [facility];  Service: Endoscopy;  Laterality: N/A;   ESOPHAGOGASTRODUODENOSCOPY (EGD) WITH PROPOFOL N/A 08/11/2021   Procedure: ESOPHAGOGASTRODUODENOSCOPY (EGD) WITH PROPOFOL;  Surgeon: Toney Reil, MD;  Location: [facility] END[facility];  Service: Gastroenterology;  Laterality: N/A;   KNEE ARTHROSCOPY Right 2009   NISSEN FUNDOPLICATION     PULMONARY THROMBECTOMY N/A 10/16/2018   Procedure: PULMONARY THROMBECTOMY;  Surgeon: Renford Dills, MD;  Location: [facility] INVASIVE CV LAB;  Service: Cardiovascular;  Laterality: N/A;    Current Outpatient Medications:    amphetamine-dextroamphetamine (ADDERALL) 20 MG tablet, Take 20 mg by mouth daily., Disp: , Rfl:    ARIPiprazole (ABILIFY) 15 MG tablet, Take 15 mg by mouth daily., Disp: , Rfl:    aspirin EC 81 MG tablet, Take 81 mg by mouth daily. Swallow whole., Disp: , Rfl:    DULoxetine (CYMBALTA) 60 MG capsule, Take 60 mg by mouth 2 (two) times daily., Disp: , Rfl:    ezetimibe (ZETIA) 10 MG tablet, Take 10 mg by mouth daily., Disp: , Rfl:    furosemide (LASIX) 20 MG tablet, Take 20 mg by mouth daily., Disp: , Rfl:    ketoconazole (NIZORAL) 2 % shampoo, Apply topically., Disp: , Rfl:    losartan (COZAAR) 25 MG tablet, , Disp: , Rfl:    metoprolol succinate (TOPROL-XL) 25 MG 24 hr tablet, , Disp: , Rfl:    Semaglutide,0.25 or 0.5MG /DOS, 2 MG/1.5ML SOPN, Inject into the skin., Disp: , Rfl:    testosterone cypionate (DEPOTESTOSTERONE CYPIONATE) 200 MG/ML injection, , Disp: , Rfl:    RABEprazole (ACIPHEX) 20 MG tablet, Take 1 tablet (20 mg total) by mouth 2 (two) times daily before a meal., Disp: 60 tablet, Rfl: 2    Family History  Problem Relation Age of Onset   Heart disease Father    COPD Father    Emphysema Father    Stroke Father      Social History   Tobacco Use   Smoking status: Former    Types: Cigarettes    Quit date: 05/28/2004    Years since quitting: 17.5    Smokeless tobacco: Current    Types: Chew  Vaping Use   Vaping Use: Never used  Substance Use Topics   Alcohol use: Yes    Comment: 2-3 drinks 4 days a week (liquor, beer, & wine)   Drug use: No    Allergies as of 12/08/2021   (No Known Allergies)    Review of Systems:    All systems reviewed and negative except where noted in HPI.   Physical Exam:  BP 127/85 (BP Location: Right Arm, Patient Position: Sitting, Cuff Size: Normal)    Pulse (!) 109    Temp 98.7 F (37.1 C) (Oral)    Ht 5\' 8"  (1.727 m)    Wt 244 lb 3.2 oz (110.8 kg)    BMI 37.13 kg/m  No LMP for male patient.  General:   Alert,  Well-developed, well-nourished, pleasant and cooperative in NAD Head:  Normocephalic and atraumatic. Eyes:  Sclera  clear, no icterus.   Conjunctiva pink. Ears:  Normal auditory acuity. Nose:  No deformity, discharge, or lesions. Mouth:  No deformity or lesions,oropharynx pink & moist. Neck:  Supple; no masses or thyromegaly. Lungs:  Respirations even and unlabored.  Clear throughout to auscultation.   No wheezes, crackles, or rhonchi. No acute distress. Heart:  Regular rate and rhythm; no murmurs, clicks, rubs, or gallops. Abdomen:  Normal bowel sounds. Soft, epigastric tenderness and non-distended without masses, hepatosplenomegaly or hernias noted.  No guarding or rebound tenderness.   Rectal: Not performed Msk:  Symmetrical without gross deformities. Good, equal movement & strength bilaterally. Pulses:  Normal pulses noted. Extremities:  No clubbing or edema.  No cyanosis. Neurologic:  Alert and oriented x3;  grossly normal neurologically. Skin:  Intact without significant lesions or rashes. No jaundice. Psych:  Alert and cooperative. Normal mood and affect.  Imaging Studies: Reviewed  Assessment and Plan:   Jeremaine Maraj is a 66 y.o. male with obesity, history of A. fib, history of PE, s/p thrombectomy, was on Xarelto for 1 year, metabolic syndrome, history of chronic  GERD, status post Nissen's fundoplication in 2006 is seen in consultation for chronic intermittent episodes of epigastric pain, chronic alcohol use  Chronic GERD, s/p Nissen's fundoplication Has ongoing intermittent episodes of epigastric pain EGD revealed gastric erosions, biopsies negative for H. pylori infection Patient continues to take Aciphex 20 mg p.o. twice daily before meals Strongly advised him to cut back on alcohol use.  Due to ongoing epigastric pain, advised patient to undergo right upper quadrant ultrasound to evaluate for any cholelithiasis.  Patient feels strong that redo fundoplication will fix his problem and he can stop Aciphex.  He is insisting to be referred to general surgery.  I have referred him to Dr. Everlene Farrier to evaluate for redo fundoplication and also to evaluate for gallbladder etiology.  Patient does not want to undergo right upper quadrant ultrasound  Metabolic syndrome Recommend to check LFTs by PCP Recommend ultrasound liver if LFTs are elevated  Follow up as needed   Arlyss Repress, MD

## 2021-12-29 ENCOUNTER — Other Ambulatory Visit: Payer: Self-pay

## 2021-12-29 ENCOUNTER — Ambulatory Visit (INDEPENDENT_AMBULATORY_CARE_PROVIDER_SITE_OTHER): Payer: Medicare Other | Admitting: Surgery

## 2021-12-29 ENCOUNTER — Telehealth: Payer: Self-pay

## 2021-12-29 ENCOUNTER — Encounter: Payer: Self-pay | Admitting: Surgery

## 2021-12-29 VITALS — BP 115/91 | HR 98 | Temp 98.6°F | Ht 68.0 in | Wt 240.0 lb

## 2021-12-29 DIAGNOSIS — K449 Diaphragmatic hernia without obstruction or gangrene: Secondary | ICD-10-CM | POA: Diagnosis not present

## 2021-12-29 NOTE — Telephone Encounter (Signed)
CT scheduled 01/12/22 @ 2pm @ [place].Nothing to eat/drink 4 hours prior. Please go pick up prep kit 2 days prior. ? ?Barium swallow study scheduled 01/21/22 @ 9:30 [facility]. Nothing to eat/drink 4 hours prior.  ? Dr.Pabon 01/24/22 @ 9:45. Let patient know to call his Cardiologist. ?

## 2021-12-29 NOTE — Patient Instructions (Signed)
I will call you later today to let you know of all your appointments.  ?

## 2021-12-30 ENCOUNTER — Telehealth: Payer: Self-pay

## 2021-12-30 NOTE — Telephone Encounter (Signed)
Cardiac Clearance received from Dr.Poonam Gupta-moderate risk for MACE undergoing high risk procedure-however risk may be acceptable given the benefits-hold Aspirin 7 days prior restart as soon as appropriate-continue beta blocker, antihypertensives perioperatively-no further cardiology evaluation recommended prior to procedure. ?

## 2021-12-31 ENCOUNTER — Encounter: Payer: Self-pay | Admitting: Surgery

## 2021-12-31 NOTE — Progress Notes (Signed)
Patient ID: Dwayne James, male   DOB: December 26, 1955, 66 y.o.   MRN: [id number] ? ?HPI ?Dwayne James is a 66 y.o. male  referred by Dr. Marius Ditch,  for consultation & management of symptomatic hiatal hernia. Patient reports that for more than a year, he has been experiencing intermittent episodes of severe epigastric pain burning in nature associated with nausea.   ?He does have reflux with heartburn. ? He reports having 2 episodes of severe epigastric pain since last visit.  He also reports nocturnal regurgitation , He wishes to stop PPI meds and is interested pursuing antireflux surgery. ?He has history of Nissen's fundoplication in [id number] by Dr. Rochel Brome, no report is available. ?He continues to take Aciphex 20 mg p.o. twice daily.He  ?Does have a history of PE his most recent CT scan was in 2019.  Please note that I have personally reviewed.  Since on the CT scan there is suspicious for slipped Nissen. ?He also recently underwent an upper endoscopy by Dr. Marius Ditch that I have personally reviewed the rapt seems to be partial and it is not clear-cut endoscopically where there is a true slip fundoplication or not. ?He Does have a history of PE and underwent thrombectomy.  He also had a history of paroxysmal A-fib and currently is only taking aspirin. ? ?HPI ? ?Past Medical History:  ?Diagnosis Date  ? Atrial fibrillation ([place])   ? COPD (chronic obstructive pulmonary disease) ([place])   ? GERD (gastroesophageal reflux disease)   ? Prediabetes   ? Seizure ([place]) 2009  ? x 1, pt reports it was related to an antidepressant he was taking  ? ? ?Past Surgical History:  ?Procedure Laterality Date  ? ANKLE ARTHROSCOPY Left 2001  ? ATRIAL FIBRILLATION ABLATION  04/2020  ? CARDIOVERSION    ? x 2  ? COLONOSCOPY WITH PROPOFOL N/A 06/17/2021  ? Procedure: COLONOSCOPY WITH PROPOFOL;  Surgeon: Lin Landsman, MD;  Location: [facility];  Service: Endoscopy;  Laterality: N/A;  ? ESOPHAGOGASTRODUODENOSCOPY (EGD) WITH  PROPOFOL N/A 08/11/2021  ? Procedure: ESOPHAGOGASTRODUODENOSCOPY (EGD) WITH PROPOFOL;  Surgeon: Lin Landsman, MD;  Location: [facility];  Service: Gastroenterology;  Laterality: N/A;  ? KNEE ARTHROSCOPY Right 2009  ? NISSEN FUNDOPLICATION  [id number]  ? PULMONARY THROMBECTOMY N/A 10/16/2018  ? Procedure: PULMONARY THROMBECTOMY;  Surgeon: Katha Cabal, MD;  Location: [facility];  Service: Cardiovascular;  Laterality: N/A;  ? ? ?Family History  ?Problem Relation Age of Onset  ? Heart disease Father   ? COPD Father   ? Emphysema Father   ? Stroke Father   ? ? ?Social History ?Social History  ? ?Tobacco Use  ? Smoking status: Former  ?  Types: Cigarettes  ?  Quit date: 05/28/2004  ?  Years since quitting: 17.6  ?  Passive exposure: Past  ? Smokeless tobacco: Current  ?  Types: Chew  ?Vaping Use  ? Vaping Use: Never used  ?Substance Use Topics  ? Alcohol use: Yes  ?  Comment: 2-3 drinks 4 days a week (liquor, beer, & wine)  ? Drug use: No  ? ? ?No Known Allergies ? ?Current Outpatient Medications  ?Medication Sig Dispense Refill  ? amphetamine-dextroamphetamine (ADDERALL) 20 MG tablet Take 20 mg by mouth daily.    ? ARIPiprazole (ABILIFY) 15 MG tablet Take 15 mg by mouth daily.    ? aspirin EC 81 MG tablet Take 81 mg by mouth daily. Swallow whole.    ? DULoxetine (  CYMBALTA) 60 MG capsule Take 60 mg by mouth 2 (two) times daily.    ? ezetimibe (ZETIA) 10 MG tablet Take 10 mg by mouth daily.    ? furosemide (LASIX) 20 MG tablet Take 20 mg by mouth as needed.    ? ketoconazole (NIZORAL) 2 % shampoo Apply topically.    ? losartan (COZAAR) 25 MG tablet     ? metoprolol succinate (TOPROL-XL) 25 MG 24 hr tablet     ? Semaglutide,0.25 or 0.5MG /DOS, 2 MG/1.5ML SOPN Inject into the skin.    ? SPRAVATO, 84 MG DOSE, 28 MG/DEVICE SOPK Place into both nostrils.    ? testosterone cypionate (DEPOTESTOSTERONE CYPIONATE) 200 MG/ML injection     ? RABEprazole (ACIPHEX) 20 MG tablet Take 1 tablet (20 mg total) by mouth 2  (two) times daily before a meal. 60 tablet 2  ? ?No current facility-administered medications for this visit.  ? ? ? ?Review of Systems ?Full ROS  was asked and was negative except for the information on the HPI ? ?Physical Exam ?Blood pressure (!) 115/91, pulse 98, temperature 98.6 ?F (37 ?C), height 5\' 8"  (1.727 m), weight 240 lb (108.9 kg), SpO2 100 %. ?CONSTITUTIONAL: NAD, BMI 36.5. ?EYES: Pupils are equal, round, Sclera are non-icteric. ?EARS, NOSE, MOUTH AND THROAT:Hearing is intact to voice. ?LYMPH NODES:  Lymph nodes in the neck are normal. ?RESPIRATORY:  Lungs are clear. There is normal respiratory effort, with equal breath sounds bilaterally, and without pathologic use of accessory muscles. ?CARDIOVASCULAR: Heart is regular without murmurs, gallops, or rubs. ?GI: The abdomen is  soft, nontender, and nondistended. There are no palpable masses. There is no hepatosplenomegaly. There are normal bowel sounds in all quadrants. ?GU: Rectal deferred.   ?MUSCULOSKELETAL: Normal muscle strength and tone. No cyanosis or edema.   ?SKIN: Turgor is good and there are no pathologic skin lesions or ulcers. ?NEUROLOGIC: Motor and sensation is grossly normal. Cranial nerves are grossly intact. ?PSYCH:  Oriented to person, place and time. Affect is normal. ? ?Data Reviewed ? ?I have personally reviewed the patient's imaging, laboratory findings and medical records.   ? ?Assessment/Plan ?66 year old male with partially undone wrap versus slipped Nissen fundoplication with persistent reflux symptoms.  Discussed with the patient in detail about his disease process.  He is interested in being off the PPI and pursuing an antireflux procedure.  I also discussed with him in detail about optimization of his weight.  Right now his BMI is 36.5 and he needs to be in a better place before we attempt another fundoplication.  He is interested in pursuing work-up and we will perform a CT scan of the abdomen pelvis to delineate his  intra-abdominal anatomy and we will also obtain a barium swallow.  We will see him back in a few weeks.  Discussed with him about diet and exercise.  Also discussed with him about obtaining preoperative optimization by his cardiology who is in [place].  With [facility] ? ?Please note that I spent greater than 60 minutes in this encounter including personally reviewing imaging studies, medical records, placing orders, coordinating his care and performing appropriate documentation ? ? ?Caroleen Hamman, MD FACS ?General Surgeon ?12/31/2021, 1:47 PM ? ?  ?

## 2022-01-12 ENCOUNTER — Other Ambulatory Visit: Payer: Self-pay

## 2022-01-12 ENCOUNTER — Ambulatory Visit
Admission: RE | Admit: 2022-01-12 | Discharge: 2022-01-12 | Disposition: A | Discharge status: Home / Self Care | Payer: Medicare Other | Source: Ambulatory Visit | Attending: Surgery | Admitting: Surgery

## 2022-01-12 DIAGNOSIS — K449 Diaphragmatic hernia without obstruction or gangrene: Secondary | ICD-10-CM | POA: Diagnosis present

## 2022-01-12 HISTORY — DX: Essential (primary) hypertension: I10

## 2022-01-12 LAB — POCT I-STAT CREATININE: Creatinine, Ser: 1.2 mg/dL (ref 0.61–1.24)

## 2022-01-12 MED ORDER — IOHEXOL 300 MG/ML  SOLN
100.0000 mL | Freq: Once | INTRAMUSCULAR | Status: AC | PRN
  Administered 2022-01-12: 100 mL via INTRAVENOUS

## 2022-01-17 ENCOUNTER — Telehealth: Payer: Self-pay

## 2022-01-17 NOTE — Telephone Encounter (Signed)
Patient notified of CT results-keep scheduled appointment. ?

## 2022-01-21 ENCOUNTER — Ambulatory Visit
Admission: RE | Admit: 2022-01-21 | Discharge: 2022-01-21 | Disposition: A | Discharge status: Home / Self Care | Payer: Medicare Other | Source: Ambulatory Visit | Attending: Surgery | Admitting: Surgery

## 2022-01-21 ENCOUNTER — Other Ambulatory Visit: Payer: Self-pay

## 2022-01-21 DIAGNOSIS — K449 Diaphragmatic hernia without obstruction or gangrene: Secondary | ICD-10-CM | POA: Insufficient documentation

## 2022-01-24 ENCOUNTER — Other Ambulatory Visit: Payer: Self-pay

## 2022-01-24 ENCOUNTER — Encounter: Payer: Self-pay | Admitting: Surgery

## 2022-01-24 ENCOUNTER — Telehealth: Payer: Self-pay

## 2022-01-24 ENCOUNTER — Ambulatory Visit (INDEPENDENT_AMBULATORY_CARE_PROVIDER_SITE_OTHER): Payer: Medicare Other | Admitting: Surgery

## 2022-01-24 VITALS — BP 91/69 | HR 78 | Temp 97.8°F | Ht 68.0 in | Wt 239.0 lb

## 2022-01-24 DIAGNOSIS — K2100 Gastro-esophageal reflux disease with esophagitis, without bleeding: Secondary | ICD-10-CM

## 2022-01-24 DIAGNOSIS — R109 Unspecified abdominal pain: Secondary | ICD-10-CM | POA: Diagnosis not present

## 2022-01-24 DIAGNOSIS — K449 Diaphragmatic hernia without obstruction or gangrene: Secondary | ICD-10-CM

## 2022-01-24 NOTE — Patient Instructions (Signed)
I will call you with the appointment times and dates for the [facility] Manometry and the right upper quadrant ultrasound.  ? ? ?

## 2022-01-24 NOTE — Telephone Encounter (Signed)
RUQ Ultrasound scheduled 01/27/22 @ 8am. Nothing to eat/drink after midnight. Patient notified. ?

## 2022-01-24 NOTE — Telephone Encounter (Signed)
Referral sent to [facility] lab for 24 HR PH probe and esophageal manometry.  ?Patient notified and asked to call our office to schedule follow up appointment with Dr.Pabon once testing is complete. ?

## 2022-01-26 ENCOUNTER — Encounter: Payer: Self-pay | Admitting: Surgery

## 2022-01-26 NOTE — Progress Notes (Signed)
Outpatient Surgical Follow Up ? ?01/26/2022 ? ?Dwayne James is an 66 y.o. male.  ? ?Chief Complaint  ?Patient presents with  ? Follow-up  ?  Hiatal hernia  ? ? ?HPI: Dwayne James is a 66 y.o. male  rfollowing for GERD and ? Recurrent hiatal hernia. Patient reports that for more than a year, he has been experiencing intermittent episodes of severe epigastric pain burning in nature associated with nausea.   ?He does have reflux with heartburn. ? He reports having 2 episodes of severe epigastric pain since last visit.  He also reports nocturnal regurgitation , He wishes to stop PPI meds and is interested pursuing antireflux surgery. ?He has history of Nissen's fundoplication in 2006 by Dr. Renda Rolls, no report is available. ?He continues to take Aciphex 20 mg p.o. twice daily.He  ?Does have a history of PE his most recent CT scan was in 2019.  Please note that I have personally reviewed.  Since on the CT scan there is suspicious for slipped Nissen. ?He also recently underwent an upper endoscopy by Dr. Allegra Lai that I have personally reviewed the rapt seems to be partial and it is not clear-cut endoscopically where there is a true slip fundoplication or not. ?He Does have a history of PE and underwent thrombectomy.  He also had a history of paroxysmal A-fib and currently is only taking aspirin. ?He underwent a CT of the abdomen and pelvis as well as a barium swallow that I personally reviewed.  CT show actually an intact wrap to an barium swallow showed no evidence of paraesophageal hernia.  Also CT scan shows gallstones without cholecystitis ? ?Past Medical History:  ?Diagnosis Date  ? Atrial fibrillation (HCC)   ? COPD (chronic obstructive pulmonary disease) (HCC)   ? GERD (gastroesophageal reflux disease)   ? Hypertension   ? Prediabetes   ? Seizure (HCC) 2009  ? x 1, pt reports it was related to an antidepressant he was taking  ? ? ?Past Surgical History:  ?Procedure Laterality Date  ? ANKLE  ARTHROSCOPY Left 2001  ? ATRIAL FIBRILLATION ABLATION  04/2020  ? CARDIOVERSION    ? x 2  ? COLONOSCOPY WITH PROPOFOL N/A 06/17/2021  ? Procedure: COLONOSCOPY WITH PROPOFOL;  Surgeon: Toney Reil, MD;  Location: [facility];  Service: Endoscopy;  Laterality: N/A;  ? ESOPHAGOGASTRODUODENOSCOPY (EGD) WITH PROPOFOL N/A 08/11/2021  ? Procedure: ESOPHAGOGASTRODUODENOSCOPY (EGD) WITH PROPOFOL;  Surgeon: Toney Reil, MD;  [facility]: [facility] END[facility]COPY;  Service: Gastroenterology;  Laterality: N/A;  ? KNEE ARTHROSCOPY Right 2009  ? NISSEN FUNDOPLICATION  2006  ? PULMONARY THROMBECTOMY N/A 10/16/2018  ? Procedure: PULMONARY THROMBECTOMY;  Surgeon: Renford Dills, MD;  Location: [facility] INVASIVE CV LAB;  Service: Cardiovascular;  Laterality: N/A;  ? ? ?Family History  ?Problem Relation Age of Onset  ? Heart disease Father   ? COPD Father   ? Emphysema Father   ? Stroke Father   ? ? ?Social History:  reports that he quit smoking about 17 years ago. His smoking use included cigarettes. He has been exposed to tobacco smoke. His smokeless tobacco use includes chew. He reports current alcohol use. He reports that he does not use drugs. ? ?Allergies: No Known Allergies ? ?Medications reviewed. ? ? ? ?ROS ?Full ROS performed and is otherwise negative other than what is stated in HPI ? ? ?BP 91/69   Pulse 78   Temp 97.8 ?F (36.6 ?C) (Oral)   Ht 5\' 8"  (1.727  m)   Wt 239 lb (108.4 kg)   BMI 36.34 kg/m?  ? ?Physical Exam ?Vitals and nursing note reviewed. Exam conducted with a chaperone present.  ?Constitutional:   ?   Appearance: Normal appearance. He is obese. He is not toxic-appearing.  ?Eyes:  ?   General:     ?   Right eye: No discharge.     ?   Left eye: No discharge.  ?Cardiovascular:  ?   Rate and Rhythm: Normal rate and regular rhythm.  ?   Heart sounds: No murmur heard. ?Pulmonary:  ?   Effort: Pulmonary effort is normal. No respiratory distress.  ?   Breath sounds: Normal breath sounds. No stridor.   ?Abdominal:  ?   General: Abdomen is flat. There is no distension.  ?   Palpations: Abdomen is soft. There is no mass.  ?   Tenderness: There is no abdominal tenderness. There is no guarding or rebound.  ?   Hernia: No hernia is present.  ?Musculoskeletal:     ?   General: Normal range of motion.  ?   Cervical back: Normal range of motion and neck supple. No rigidity.  ?Skin: ?   General: Skin is warm and dry.  ?   Capillary Refill: Capillary refill takes less than 2 seconds.  ?Neurological:  ?   General: No focal deficit present.  ?   Mental Status: He is alert and oriented to person, place, and time.  ?Psychiatric:     ?   Mood and Affect: Mood normal.     ?   Behavior: Behavior normal.     ?   Thought Content: Thought content normal.     ?   Judgment: Judgment normal.  ? ? ? ?Assessment/Plan: ?66 year old male with prior history of Nissen fundoplication and now clinical symptoms of intractable reflux.  So far work-up has showed an intact wrap and gallstones.  Difficult situations since he is convinced that his symptoms are back and related to GERD.  To be thorough I am going assess the function of the esophagus and also perform a pH probe test to determine the relationship between the reflux episodes and his clinical symptomatology.  Also discussed with him that he does have gallstones and sometimes biliary colic can be atypical., I have ordered U/S RUQ.  He is somewhat frustrated since I am unable to find a clear-cut answer for his upper abdominal pain.  I had an extensive discussion with him about the limitations of diagnostic work-up for abdominal pain and sometimes for reflux.  He understands.  I will see him back once he completes extensive esophageal work-up.  No need for emergent surgical intervention.  Please note  that I spent greater than 45 minutes in this encounter including coordination of his care, personally reviewing images, placing orders and performing appropriate documentation ? ?Sterling Big,  MD FACS ?General Surgeon  ?

## 2022-01-27 ENCOUNTER — Ambulatory Visit: Admission: RE | Admit: 2022-01-27 | Payer: Medicare Other | Source: Ambulatory Visit

## 2022-01-28 ENCOUNTER — Telehealth: Payer: Self-pay

## 2022-01-28 NOTE — Telephone Encounter (Signed)
Spoke with Patient to let him know he can reach out to schedule the [facility] probe study and the Esophageal manometry by calling [phone]. He stated he no show yesterday for RUQ Ultrasound-number provided for him to call and reschedule [phone].  ?Once all the above has been done he can call office to schedule a follow up appointment.  ?

## 2022-01-31 ENCOUNTER — Telehealth: Payer: Self-pay

## 2022-01-31 NOTE — Telephone Encounter (Signed)
Spoke with patient- he will call the [facility] to schedule the Manometry test and then call our office to let us know when it has been scheduled - also reminded patient to call after testing has been done to schedule follow up with Dr.Pabon. ?

## 2022-02-17 ENCOUNTER — Ambulatory Visit
Admission: RE | Admit: 2022-02-17 | Discharge: 2022-02-17 | Disposition: A | Discharge status: Home / Self Care | Payer: Medicare Other | Source: Ambulatory Visit | Attending: Surgery | Admitting: Surgery

## 2022-02-17 DIAGNOSIS — R109 Unspecified abdominal pain: Secondary | ICD-10-CM

## 2022-02-17 DIAGNOSIS — K449 Diaphragmatic hernia without obstruction or gangrene: Secondary | ICD-10-CM | POA: Diagnosis present

## 2022-02-24 ENCOUNTER — Emergency Department: Payer: Medicare Other

## 2022-02-24 ENCOUNTER — Other Ambulatory Visit: Payer: Self-pay

## 2022-02-24 ENCOUNTER — Inpatient Hospital Stay
Admission: EM | Admit: 2022-02-24 | Discharge: 2022-02-26 | DRG: 163 | Disposition: A | Discharge status: Home / Self Care | Payer: Medicare Other | Attending: Internal Medicine | Admitting: Internal Medicine

## 2022-02-24 DIAGNOSIS — E119 Type 2 diabetes mellitus without complications: Secondary | ICD-10-CM

## 2022-02-24 DIAGNOSIS — I110 Hypertensive heart disease with heart failure: Secondary | ICD-10-CM | POA: Diagnosis present

## 2022-02-24 DIAGNOSIS — I10 Essential (primary) hypertension: Secondary | ICD-10-CM | POA: Diagnosis not present

## 2022-02-24 DIAGNOSIS — E11649 Type 2 diabetes mellitus with hypoglycemia without coma: Secondary | ICD-10-CM | POA: Diagnosis not present

## 2022-02-24 DIAGNOSIS — E1159 Type 2 diabetes mellitus with other circulatory complications: Secondary | ICD-10-CM | POA: Diagnosis not present

## 2022-02-24 DIAGNOSIS — I2602 Saddle embolus of pulmonary artery with acute cor pulmonale: Secondary | ICD-10-CM | POA: Diagnosis present

## 2022-02-24 DIAGNOSIS — Z8249 Family history of ischemic heart disease and other diseases of the circulatory system: Secondary | ICD-10-CM | POA: Diagnosis not present

## 2022-02-24 DIAGNOSIS — Z86711 Personal history of pulmonary embolism: Secondary | ICD-10-CM | POA: Diagnosis not present

## 2022-02-24 DIAGNOSIS — Z825 Family history of asthma and other chronic lower respiratory diseases: Secondary | ICD-10-CM | POA: Diagnosis not present

## 2022-02-24 DIAGNOSIS — K219 Gastro-esophageal reflux disease without esophagitis: Secondary | ICD-10-CM | POA: Diagnosis present

## 2022-02-24 DIAGNOSIS — R778 Other specified abnormalities of plasma proteins: Secondary | ICD-10-CM

## 2022-02-24 DIAGNOSIS — I480 Paroxysmal atrial fibrillation: Secondary | ICD-10-CM | POA: Diagnosis present

## 2022-02-24 DIAGNOSIS — I2699 Other pulmonary embolism without acute cor pulmonale: Secondary | ICD-10-CM | POA: Diagnosis present

## 2022-02-24 DIAGNOSIS — I5022 Chronic systolic (congestive) heart failure: Secondary | ICD-10-CM | POA: Diagnosis present

## 2022-02-24 DIAGNOSIS — Z6835 Body mass index (BMI) 35.0-35.9, adult: Secondary | ICD-10-CM | POA: Diagnosis not present

## 2022-02-24 DIAGNOSIS — Z79899 Other long term (current) drug therapy: Secondary | ICD-10-CM

## 2022-02-24 DIAGNOSIS — Z818 Family history of other mental and behavioral disorders: Secondary | ICD-10-CM | POA: Diagnosis not present

## 2022-02-24 DIAGNOSIS — Z7982 Long term (current) use of aspirin: Secondary | ICD-10-CM

## 2022-02-24 DIAGNOSIS — F10939 Alcohol use, unspecified with withdrawal, unspecified: Secondary | ICD-10-CM | POA: Diagnosis not present

## 2022-02-24 DIAGNOSIS — J9601 Acute respiratory failure with hypoxia: Secondary | ICD-10-CM | POA: Diagnosis present

## 2022-02-24 DIAGNOSIS — I2609 Other pulmonary embolism with acute cor pulmonale: Secondary | ICD-10-CM | POA: Diagnosis not present

## 2022-02-24 DIAGNOSIS — F17220 Nicotine dependence, chewing tobacco, uncomplicated: Secondary | ICD-10-CM | POA: Diagnosis present

## 2022-02-24 DIAGNOSIS — F32A Depression, unspecified: Secondary | ICD-10-CM | POA: Diagnosis present

## 2022-02-24 DIAGNOSIS — I2692 Saddle embolus of pulmonary artery without acute cor pulmonale: Secondary | ICD-10-CM

## 2022-02-24 DIAGNOSIS — J449 Chronic obstructive pulmonary disease, unspecified: Secondary | ICD-10-CM | POA: Diagnosis present

## 2022-02-24 DIAGNOSIS — E669 Obesity, unspecified: Secondary | ICD-10-CM | POA: Diagnosis present

## 2022-02-24 DIAGNOSIS — R0609 Other forms of dyspnea: Principal | ICD-10-CM

## 2022-02-24 DIAGNOSIS — F10930 Alcohol use, unspecified with withdrawal, uncomplicated: Secondary | ICD-10-CM | POA: Diagnosis not present

## 2022-02-24 LAB — APTT: aPTT: 30 seconds (ref 24–36)

## 2022-02-24 LAB — CBC
HCT: 49.7 % (ref 39.0–52.0)
Hemoglobin: 17 g/dL (ref 13.0–17.0)
MCH: 34.8 pg — ABNORMAL HIGH (ref 26.0–34.0)
MCHC: 34.2 g/dL (ref 30.0–36.0)
MCV: 101.6 fL — ABNORMAL HIGH (ref 80.0–100.0)
Platelets: 196 10*3/uL (ref 150–400)
RBC: 4.89 MIL/uL (ref 4.22–5.81)
RDW: 12.1 % (ref 11.5–15.5)
WBC: 11.2 10*3/uL — ABNORMAL HIGH (ref 4.0–10.5)
nRBC: 0 % (ref 0.0–0.2)

## 2022-02-24 LAB — BASIC METABOLIC PANEL
Anion gap: 18 — ABNORMAL HIGH (ref 5–15)
BUN: 18 mg/dL (ref 8–23)
CO2: 22 mmol/L (ref 22–32)
Calcium: 9 mg/dL (ref 8.9–10.3)
Chloride: 96 mmol/L — ABNORMAL LOW (ref 98–111)
Creatinine, Ser: 1.19 mg/dL (ref 0.61–1.24)
GFR, Estimated: >60 mL/min (ref >60)
Glucose, Bld: 119 mg/dL — ABNORMAL HIGH (ref 70–99)
Potassium: 3.8 mmol/L (ref 3.5–5.1)
Sodium: 136 mmol/L (ref 135–145)

## 2022-02-24 LAB — TROPONIN I (HIGH SENSITIVITY)
Troponin I (High Sensitivity): 76 ng/L — ABNORMAL HIGH (ref <18)
Troponin I (High Sensitivity): 76 ng/L — ABNORMAL HIGH (ref <18)

## 2022-02-24 LAB — HEMOGLOBIN A1C
Hgb A1c MFr Bld: 5.7 % — ABNORMAL HIGH (ref 4.8–5.6)
Mean Plasma Glucose: 116.89 mg/dL

## 2022-02-24 LAB — CBG MONITORING, ED
Glucose-Capillary: 141 mg/dL — ABNORMAL HIGH (ref 70–99)
Glucose-Capillary: 122 mg/dL — ABNORMAL HIGH (ref 70–99)

## 2022-02-24 LAB — PROTIME-INR
INR: 1.2 (ref 0.8–1.2)
Prothrombin Time: 15 seconds (ref 11.4–15.2)

## 2022-02-24 LAB — HEPARIN LEVEL (UNFRACTIONATED)
Heparin Unfractionated: <0.1 IU/mL — ABNORMAL LOW (ref 0.30–0.70)
Heparin Unfractionated: 0.4 IU/mL (ref 0.30–0.70)

## 2022-02-24 MED ORDER — CEFAZOLIN SODIUM-DEXTROSE 2-4 GM/100ML-% IV SOLN
2.0000 g | INTRAVENOUS | Status: AC
  Filled 2022-02-24: qty 100

## 2022-02-24 MED ORDER — ONDANSETRON HCL 4 MG/2ML IJ SOLN: 4.0000 mg | Freq: Four times a day (QID) | INTRAMUSCULAR | Status: DC | PRN

## 2022-02-24 MED ORDER — ONDANSETRON HCL 4 MG PO TABS: 4.0000 mg | ORAL_TABLET | Freq: Four times a day (QID) | ORAL | Status: DC | PRN

## 2022-02-24 MED ORDER — INSULIN ASPART 100 UNIT/ML IJ SOLN
0.0000 [IU] | Freq: Three times a day (TID) | INTRAMUSCULAR | Status: DC
  Administered 2022-02-24: 2 [IU] via SUBCUTANEOUS
  Filled 2022-02-24: qty 1

## 2022-02-24 MED ORDER — HEPARIN (PORCINE) 25000 UT/250ML-% IV SOLN
1500.0000 [IU]/h | INTRAVENOUS | Status: DC
  Administered 2022-02-24: 1200 [IU]/h via INTRAVENOUS
  Administered 2022-02-25: 1500 [IU]/h via INTRAVENOUS
  Filled 2022-02-24 (×2): qty 250

## 2022-02-24 MED ORDER — ALBUTEROL SULFATE HFA 108 (90 BASE) MCG/ACT IN AERS
2.0000 | INHALATION_SPRAY | RESPIRATORY_TRACT | Status: DC | PRN
  Filled 2022-02-24: qty 6.7

## 2022-02-24 MED ORDER — SODIUM CHLORIDE 0.9 % IV SOLN: INTRAVENOUS | Status: DC

## 2022-02-24 MED ORDER — ASPIRIN EC 81 MG PO TBEC: 81.0000 mg | DELAYED_RELEASE_TABLET | Freq: Every day | ORAL | Status: DC

## 2022-02-24 MED ORDER — HEPARIN BOLUS VIA INFUSION
4000.0000 [IU] | Freq: Once | INTRAVENOUS | Status: AC
  Administered 2022-02-24: 4000 [IU] via INTRAVENOUS
  Filled 2022-02-24: qty 4000

## 2022-02-24 MED ORDER — AMPHETAMINE-DEXTROAMPHET ER 30 MG PO CP24
30.0000 mg | ORAL_CAPSULE | Freq: Every day | ORAL | Status: DC
  Administered 2022-02-26: 30 mg via ORAL
  Filled 2022-02-24: qty 1

## 2022-02-24 MED ORDER — HEPARIN BOLUS VIA INFUSION
1500.0000 [IU] | Freq: Once | INTRAVENOUS | Status: AC
  Administered 2022-02-24: 1500 [IU] via INTRAVENOUS
  Filled 2022-02-24: qty 1500

## 2022-02-24 MED ORDER — IOHEXOL 350 MG/ML SOLN
100.0000 mL | Freq: Once | INTRAVENOUS | Status: AC | PRN
  Administered 2022-02-24: 100 mL via INTRAVENOUS

## 2022-02-24 MED ORDER — ESKETAMINE HCL (84 MG DOSE) 28 MG/DEVICE NA SOPK: PACK | Freq: Every day | NASAL | Status: DC

## 2022-02-24 MED ORDER — METOPROLOL SUCCINATE ER 50 MG PO TB24: 25.0000 mg | ORAL_TABLET | Freq: Every day | ORAL | Status: DC

## 2022-02-24 MED ORDER — ARIPIPRAZOLE 15 MG PO TABS
15.0000 mg | ORAL_TABLET | Freq: Every day | ORAL | Status: DC
  Administered 2022-02-26: 15 mg via ORAL
  Filled 2022-02-24: qty 1

## 2022-02-24 MED ORDER — DULOXETINE HCL 60 MG PO CPEP: 60.0000 mg | ORAL_CAPSULE | Freq: Two times a day (BID) | ORAL | Status: DC

## 2022-02-24 MED ORDER — PANTOPRAZOLE SODIUM 40 MG PO TBEC
40.0000 mg | DELAYED_RELEASE_TABLET | Freq: Every day | ORAL | Status: DC
  Administered 2022-02-25 – 2022-02-26 (×2): 40 mg via ORAL
  Filled 2022-02-24 (×3): qty 1

## 2022-02-24 MED ORDER — EZETIMIBE 10 MG PO TABS
10.0000 mg | ORAL_TABLET | Freq: Every day | ORAL | Status: DC
  Administered 2022-02-26: 10 mg via ORAL
  Filled 2022-02-24 (×2): qty 1

## 2022-02-24 MED ORDER — AMPHETAMINE-DEXTROAMPHETAMINE 10 MG PO TABS
20.0000 mg | ORAL_TABLET | Freq: Every day | ORAL | Status: DC
Start: 2022-02-24 — End: 2022-02-24

## 2022-02-24 MED ORDER — MORPHINE SULFATE (PF) 2 MG/ML IV SOLN: 2.0000 mg | INTRAVENOUS | Status: DC | PRN

## 2022-02-24 NOTE — ED Notes (Signed)
This RN called lab to make sure they had enough blood/ correct tubes for heparin level blood result. Lab assures this RN that they have enough, and will run now. ?

## 2022-02-24 NOTE — Assessment & Plan Note (Addendum)
Saddle pulmonary embolism with cor pulmonale.  Dr. Lebron Quam did a thrombectomy and thrombolysis on 02/25/2022.  Patient was switched over to Xarelto for anticoagulation.  Since this is a second blood clot he will be on lifelong anticoagulation.  Risk of bleeding explained to patient. ?

## 2022-02-24 NOTE — Progress Notes (Signed)
? ?  Patient: Dwayne James [id number] DOB: 12/23/1955 ?DOA: 02/24/2022 ? ?Subjective: follow up to borderline blood pressures and tachycardia ?Admits to becoming very short winded and weak with minimal activity. Sats were 88% on room air with patient lying in bed. Denies recent activity. Reports he feels his degree of shortness of breath has not worsened since admit ?Patient admits he feels similar to past with PE ?Objective: ?Vitals:  ? 02/24/22 1845 02/24/22 1915  ?BP: 97/67 (!) 134/93  ?Pulse: (!) 115 (!) 117  ?Resp: (!) 22 (!) 28  ?Temp:    ?SpO2: 95% 93%  ? ? ? ?Assessment:  ?Neuro - alert and oriented, no significant distress ?CV ST 109 ?RESP as above sats above 88%. Improved to 93 with oxygen titrated up to [address]. Short shallow breathing pattern 24-28 breaths per minute ? ?Plan:  ?Acute respiratory failure with hypoxia ?Continue supplemental oxygen keep sats above 92% ?Incentive spirometer  ?Discussed possible need for BIPAP should his work of breathing worsened ?Upgrade level of care to stepdown due to high risk of decompensation ? ?Tachcardia ?Multifactorial ?Metoprolol not given secondary to borderline low blood pressures. Plan for IV doses for rates above 120 if pressures remain stable ? ? ?Critical care time 30 minutes ? ?Critical care time needed for evaluating patient wit acute hypoxia and high risk for decompensation. Time spent for patient physical evaluation and review/order of interventions prior history ? ?  ?

## 2022-02-24 NOTE — H&P (Signed)
?History and Physical  ? ? ?Patient: Dwayne James VOJ:[id number] DOB: Jan 16, 1956 ?DOA: 02/24/2022 ?DOS: the patient was seen and examined on 02/24/2022 ?PCP: Racheal Patches, MD  ?Patient coming from: Home ? ?Chief Complaint:  ?Chief Complaint  ?Patient presents with  ? Shortness of Breath  ? ?HPI: Dwayne James is a 66 y.o. male with medical history significant for paroxysmal A-fib status post ablation, history of pulmonary embolism status post thrombectomy (was taken off anticoagulation about a year ago), obesity, depression, GERD, hypertension, chronic systolic heart failure with last known LVEF of 40% from 02/21 who presents to the ER for evaluation of exertional shortness of breath that he has had for about 3 days. ?Patient states that with any form of exertion he has difficulty breathing which is relieved at rest.  He denies having any chest pain.  He had a syncopal episode 1 day prior to his admission.  He complains of feeling dizzy and lightheaded. ?He denies having any cough, no fever, no chills, no abdominal pain, no changes in his bowel habits, no headache, no blurred vision, no leg swelling or any focal deficits. ?Review of Systems: As mentioned in the history of present illness. All other systems reviewed and are negative. ?Past Medical History:  ?Diagnosis Date  ? Atrial fibrillation (HCC)   ? COPD (chronic obstructive pulmonary disease) (HCC)   ? GERD (gastroesophageal reflux disease)   ? Hypertension   ? Prediabetes   ? Seizure (HCC) 2009  ? x 1, pt reports it was related to an antidepressant he was taking  ? ?Past Surgical History:  ?Procedure Laterality Date  ? ANKLE ARTHROSCOPY Left 2001  ? ATRIAL FIBRILLATION ABLATION  04/2020  ? CARDIOVERSION    ? x 2  ? COLONOSCOPY WITH PROPOFOL N/A 06/17/2021  ? Procedure: COLONOSCOPY WITH PROPOFOL;  Surgeon: Toney Reil, MD;  Location: [facility];  Service: Endoscopy;  Laterality: N/A;  ? ESOPHAGOGASTRODUODENOSCOPY (EGD) WITH  PROPOFOL N/A 08/11/2021  ? Procedure: ESOPHAGOGASTRODUODENOSCOPY (EGD) WITH PROPOFOL;  Surgeon: Toney Reil, MD;  Location: [facility] ENDOSCOPY;  Service: Gastroenterology;  Laterality: N/A;  ? KNEE ARTHROSCOPY Right 2009  ? NISSEN FUNDOPLICATION  2006  ? PULMONARY THROMBECTOMY N/A 10/16/2018  ? Procedure: PULMONARY THROMBECTOMY;  Surgeon: Renford Dills, MD;  Location: [facility] IN[facility]ASIVE [facility];  Service: Cardiovascular;  Laterality: N/A;  ? ?Social History:  reports that he quit smoking about 17 years ago. His smoking use included cigarettes. He has been exposed to tobacco smoke. His smokeless tobacco use includes chew. He reports current alcohol use. He reports that he does not use drugs. ? ?No Known Allergies ? ?Family History  ?Problem Relation Age of Onset  ? Heart disease Father   ? COPD Father   ? Emphysema Father   ? Stroke Father   ? ? ?Prior to Admission medications   ?Medication Sig Start Date End Date Taking? Authorizing Provider  ?amphetamine-dextroamphetamine (ADDERALL) 20 MG tablet Take 20 mg by mouth daily.    [provider]  ?ARIPiprazole (ABILIFY) 15 MG tablet Take 15 mg by mouth daily.    [provider]  ?aspirin EC 81 MG tablet Take 81 mg by mouth daily. Swallow whole.    [provider]  ?DULoxetine (CYMBALTA) 60 MG capsule Take 60 mg by mouth 2 (two) times daily.    [provider]  ?ezetimibe (ZETIA) 10 MG tablet Take 10 mg by mouth daily.    [provider]  ?furosemide (LASIX) 20  MG tablet Take 20 mg by mouth as needed. 02/24/21   [provider]  ?ketoconazole (NIZORAL) 2 % shampoo Apply topically. 04/20/21   [provider]  ?losartan (COZAAR) 25 MG tablet  08/06/21   [provider]  ?metoprolol succinate (TOPROL-XL) 25 MG 24 hr tablet  08/06/21   [provider]  ?RABEprazole (ACIPHEX) 20 MG tablet Take 1 tablet (20 mg total) by mouth 2 (two) times daily before a meal. 08/04/21 09/03/21  Vanga, Loel Dubonnet,  MD  ?Semaglutide,0.25 or 0.5MG /DOS, 2 MG/1.5ML SOPN Inject into the skin.    [provider]  ?SPRAVATO, 84 MG DOSE, 28 MG/DEVICE SOPK Place into both nostrils. 12/20/21   [provider]  ?testosterone cypionate (DEPOTESTOSTERONE CYPIONATE) 200 MG/ML injection  08/06/21   [provider]  ? ? ?Physical Exam: ?Vitals:  ? 02/24/22 1530 02/24/22 1600 02/24/22 1615 02/24/22 1630  ?BP: (!) 140/43  (!) 121/94 (!) 135/97  ?Pulse: (!) 106 (!) 110 (!) 107 (!) 110  ?Resp: 18 20 (!) 24 20  ?Temp:      ?TempSrc:      ?SpO2: 93% 95% 95% 94%  ?Weight:      ?Height:      ? ?Physical Exam ?Vitals and nursing note reviewed.  ?Constitutional:   ?   Appearance: He is obese.  ?HENT:  ?   Head: Normocephalic and atraumatic.  ?   Mouth/Throat:  ?   Mouth: Mucous membranes are moist.  ?Eyes:  ?   Pupils: Pupils are equal, round, and reactive to light.  ?Cardiovascular:  ?   Rate and Rhythm: Tachycardia present.  ?Pulmonary:  ?   Comments: Able to speak in complete sentences ?Abdominal:  ?   General: Bowel sounds are normal.  ?   Palpations: Abdomen is soft.  ?   Comments: Central adiposity  ?Musculoskeletal:     ?   General: Normal range of motion.  ?   Cervical back: Normal range of motion and neck supple.  ?Skin: ?   General: Skin is warm and dry.  ?Neurological:  ?   General: No focal deficit present.  ?   Mental Status: He is alert.  ?Psychiatric:     ?   Mood and Affect: Mood normal.     ?   Behavior: Behavior normal.  ? ? ?Data Reviewed: ?Relevant notes from primary care and specialist visits, past discharge summaries as available in EHR, including Care Everywhere. ?Prior diagnostic testing as pertinent to current admission diagnoses ?Updated medications and problem lists for reconciliation ?ED course, including vitals, labs, imaging, treatment and response to treatment ?Triage notes, nursing and pharmacy notes and ED provider's notes ?Notable results as noted in HPI ?Labs reviewed.  Troponin 76, sodium  136, potassium 3.8, chloride 96, bicarb 22, glucose 119, BUN 18, creatinine 1.19, calcium 9.0, white count 11.2, hemoglobin 17.0, hematocrit 49.7, platelet count 196, INR 1.2 ?Chest x-ray reviewed by me shows no evidence of acute cardiopulmonary disease ?CT angiogram of the chest shows Large saddle type pulmonary embolus is noted which extends into both ?pulmonary arteries and respective lower lobe branches. Positive for ?acute PE with CT evidence of right heart strain (RV/LV Ratio = 2.3)consistent with at least submassive (intermediate risk) PE. ?Twelve-lead EKG reviewed by me shows sinus tachycardia with PVCs and premature supraventricular complexes. ?There are no new results to review at this time. ? ?Assessment and Plan: ?Pulmonary embolism (HCC) ?Patient presents to the ER for evaluation of dyspnea with mild exertion  and syncopal episode. ?He has a prior history of pulmonary embolism status post thrombectomy and his anticoagulation was discontinued a year ago ?CT angiogram shows large saddle type pulmonary embolus  which extends into both pulmonary arteries and respective lower lobe branches. Positive for acute PE with CT evidence of right heart strain ?Continue heparin drip initiated in the ER ?Obtain 2D echocardiogram to assess LVEF and rule out RV strain ?Vascular surgery has been consulted and the colectomy is planned for a.m. ? ?Paroxysmal atrial fibrillation (HCC) ?Continue metoprolol for rate control ?Patient not on long-term anticoagulation as primary prophylaxis for an acute stroke ?Continue aspirin ? ?Obesity (BMI 35.0-39.9 without comorbidity) ?Complicates overall prognosis and care ?Lifestyle modification and exercise has been discussed with patient in detail ? ?Hypertension ?Blood pressure is stable ?Continue metoprolol ? ?Diabetes mellitus (HCC) ?Maintain consistent carbohydrate diet ?Glycemic control with sliding scale insulin ? ?Depression ?Continue Abilify, Cymbalta and esketamine ? ?Chronic  systolic CHF (congestive heart failure) (HCC) ?Stable and not acutely exacerbated ?Last known LVEF from a 2D echocardiogram done 02/21 was 40% ?Hold losartan and furosemide since patient is normotensive ?

## 2022-02-24 NOTE — Assessment & Plan Note (Addendum)
Continue Toprol.  Lasix can be restarted as outpatient.  Hold ARB for right now. ?

## 2022-02-24 NOTE — ED Triage Notes (Signed)
Pt states he has had SOBx several day and has a hx of afib and PE. Pt states he cannot go more than a few feet without getting SOB. Pt able to speak in complete sentence.  ?

## 2022-02-24 NOTE — ED Notes (Signed)
Pt to be moved to bed 34 in ER for admission hold,  ?

## 2022-02-24 NOTE — ED Notes (Signed)
Pt resting on bed watching TV, update given on bed status, pt verbalized understanding, reports no needs or wants at this time, remind pt of NPO after midnight, pt verbalized understanding. Safety in place, call bell in reach, will continue to monitor pt, POC on going  ?

## 2022-02-24 NOTE — Consult Note (Signed)
ANTICOAGULATION CONSULT NOTE ? ?Pharmacy Consult for Heparin ?Indication: PE ? ?No Known Allergies ? ?Patient Measurements: ?Height: 5\' 8"  (172.7 cm) ?Weight: 106.6 kg (235 lb) ?IBW/kg (Calculated) : 68.4 ?Heparin Dosing Weight: 91.8 kg ? ?Vital Signs: ?Temp: 97.9 ?F (36.6 ?C) (04/27 1313) ?Temp Source: Oral (04/27 1313) ?BP: 121/94 (04/27 1430) ?Pulse Rate: 66 (04/27 1430) ? ?Labs: ?Recent Labs  ?  02/24/22 ?1325 02/24/22 ?1432  ?HGB 17.0  --   ?HCT 49.7  --   ?PLT 196  --   ?APTT  --  30  ?LABPROT  --  15.0  ?INR  --  1.2  ?CREATININE 1.19  --   ?TROPONINIHS 76* 76*  ? ? ? ?Estimated Creatinine Clearance: 72.3 mL/min (by C-G formula based on SCr of 1.19 mg/dL). ? ? ?Medical History: ?Past Medical History:  ?Diagnosis Date  ? Atrial fibrillation (HCC)   ? COPD (chronic obstructive pulmonary disease) (HCC)   ? GERD (gastroesophageal reflux disease)   ? Hypertension   ? Prediabetes   ? Seizure (HCC) 2009  ? x 1, pt reports it was related to an antidepressant he was taking  ? ? ?Medications:  ?No history of chronic anticoagulant use PTA ? ?Assessment: ? 66 y.o. male with a history of depression, GERD, PE, obesity who comes ED complaining of shortness of breath. Pharmacy initially consulted for heparin for ACS/STEMI and orders placed and bolus and infusion already started. Consult now for pulmonary embolism.  ? ?Goal of Therapy:  ?Heparin level 0.3-0.7 units/ml ?Monitor platelets by anticoagulation protocol: Yes ?  ?Plan:  ?Pt already received 4000 units bolus x 1. Will give additional 1500 units x 1 given new indication. And increase heparin infusion to 1500 units/hr ?Check anti-Xa level in 6 hours and daily while on heparin ?Continue to monitor H&H and platelets ? ?Anjolaoluwa Siguenza O Manar Smalling ?02/24/2022,3:58 PM ? ? ?

## 2022-02-24 NOTE — Assessment & Plan Note (Addendum)
Blood pressure is stable.  Continue Toprol but hold losartan with blood pressure being on the lower side. ?

## 2022-02-24 NOTE — Assessment & Plan Note (Addendum)
BMI 35.73 ?

## 2022-02-24 NOTE — Assessment & Plan Note (Addendum)
Hemoglobin A1c 5.7 ?

## 2022-02-24 NOTE — Consult Note (Signed)
ANTICOAGULATION CONSULT NOTE ? ?Pharmacy Consult for Heparin ?Indication: PE ? ?No Known Allergies ? ?Patient Measurements: ?Height: 5\' 8"  (172.7 cm) ?Weight: 106.6 kg (235 lb) ?IBW/kg (Calculated) : 68.4 ?Heparin Dosing Weight: 91.8 kg ? ?Vital Signs: ?Temp: 97.8 ?F (36.6 ?C) (04/27 2300) ?Temp Source: Oral (04/27 2300) ?BP: 107/84 (04/27 2300) ?Pulse Rate: 96 (04/27 2300) ? ?Labs: ?Recent Labs  ?  02/24/22 ?1325 02/24/22 ?1432 02/24/22 ?1505 02/24/22 ?2231  ?HGB 17.0  --   --   --   ?HCT 49.7  --   --   --   ?PLT 196  --   --   --   ?APTT  --  30  --   --   ?LABPROT  --  15.0  --   --   ?INR  --  1.2  --   --   ?HEPARINUNFRC  --   --  <0.10* 0.40  ?CREATININE 1.19  --   --   --   ?TROPONINIHS 76* 76*  --   --   ? ? ? ?Estimated Creatinine Clearance: 72.3 mL/min (by C-G formula based on SCr of 1.19 mg/dL). ? ? ?Medical History: ?Past Medical History:  ?Diagnosis Date  ? Atrial fibrillation (HCC)   ? COPD (chronic obstructive pulmonary disease) (HCC)   ? GERD (gastroesophageal reflux disease)   ? Hypertension   ? Prediabetes   ? Seizure (HCC) 2009  ? x 1, pt reports it was related to an antidepressant he was taking  ? ? ?Medications:  ?No history of chronic anticoagulant use PTA ? ?Assessment: ? 66 y.o. male with a history of depression, GERD, PE, obesity who comes ED complaining of shortness of breath. Pharmacy initially consulted for heparin for ACS/STEMI and orders placed and bolus and infusion already started. Consult now for pulmonary embolism.  ? ?Goal of Therapy:  ?Heparin level 0.3-0.7 units/ml ?Monitor platelets by anticoagulation protocol: Yes ?  ?Plan:  ?4/27:  HL @ 2231 = 0.4, therapeutic X 1 ?Will continue pt on current rate and recheck HL in 6 hrs on 4/28 @ 0430.  ? ?Rorie Delmore D ?02/24/2022,11:30 PM ? ? ?

## 2022-02-24 NOTE — ED Provider Notes (Signed)
3:56 PM Assumed care for off going team.  ? ?Blood pressure (!) 121/94, pulse 66, temperature 97.9 ?F (36.6 ?C), temperature source Oral, resp. rate (!) 24, height 5\' 8"  (1.727 m), weight 106.6 kg, SpO2 100 %. ? ?See their HPI for full report but in brief received a call from radiology that patient has saddle PE with right heart strain.  Patient has already been started on heparin infusion.  Discussed with pharmacy due to heparin initially being ordered for STEMI to be adjusted for PE.  They expressed understanding. ? ?I reviewed the CT personally that does show concerns for saddle PE ? ?Discussed with Dr. Delana Meyer- NPO at midnight.   ? ?Discussed with hosptialist  Dr Francine Graven for admission.  ? ? ? ? ? ? ? ?  ?Vanessa Ridge, MD ?02/24/22 1627 ? ?

## 2022-02-24 NOTE — Assessment & Plan Note (Addendum)
I will discontinue aspirin since he will be on Eliquis upon discharge. ?

## 2022-02-24 NOTE — ED Provider Notes (Signed)
? ?[facility] ?Provider Note ? ? ? Event Date/Time  ? First MD Initiated Contact with Patient 02/24/22 1442   ?  (approximate) ? ? ?History  ? ?Shortness of Breath ? ? ?HPI ? ?Dwayne James is a 66 y.o. male with a history of depression, GERD, PE, obesity who comes ED complaining of shortness of breath for the past week, gradually worsening, worse with exertion, better with rest.  Denies chest pain or any other pain complaints.  It is associated with near syncope.  Denies orthopnea.  He reports that he has as needed diuretics, but has not needed them and feels that his weight has not changed recently. ?  ? ? ?Physical Exam  ? ?Triage Vital Signs: ?ED Triage Vitals  ?Enc Vitals Group  ?   BP 02/24/22 1313 100/74  ?   Pulse Rate 02/24/22 1313 (!) 112  ?   Resp 02/24/22 1313 20  ?   Temp 02/24/22 1313 97.9 ?F (36.6 ?C)  ?   Temp Source 02/24/22 1313 Oral  ?   SpO2 02/24/22 1313 100 %  ?   Weight 02/24/22 1314 235 lb (106.6 kg)  ?   Height 02/24/22 1314 5\' 8"  (1.727 m)  ?   Head Circumference --   ?   Peak Flow --   ?   Pain Score 02/24/22 1314 0  ?   Pain Loc --   ?   Pain Edu? --   ?   Excl. in GC? --   ? ? ?Most recent vital signs: ?Vitals:  ? 02/24/22 1313 02/24/22 1430  ?BP: 100/74 (!) 121/94  ?Pulse: (!) 112 66  ?Resp: 20 (!) 24  ?Temp: 97.9 ?F (36.6 ?C)   ?SpO2: 100%   ? ? ? ?General: Awake, no distress.  ?CV:  Good peripheral perfusion.  Tachycardia, heart rate 120 ?Resp:  Normal effort.  Clear to auscultation bilaterally.  No wheezing or crackles ?Abd:  No distention.  Soft and nontender ?Other:  Trace pitting edema bilateral lower extremities.  Symmetric calf circumference, no calf tenderness. ? ? ?ED Results / Procedures / Treatments  ? ?Labs ?(all labs ordered are listed, but only abnormal results are displayed) ?Labs Reviewed  ?BASIC METABOLIC PANEL - Abnormal; Notable for the following components:  ?    Result Value  ? Chloride 96 (*)   ? Glucose, Bld 119 (*)   ? Anion gap 18  (*)   ? All other components within normal limits  ?CBC - Abnormal; Notable for the following components:  ? WBC 11.2 (*)   ? MCV 101.6 (*)   ? MCH 34.8 (*)   ? All other components within normal limits  ?TROPONIN I (HIGH SENSITIVITY) - Abnormal; Notable for the following components:  ? Troponin I (High Sensitivity) 76 (*)   ? All other components within normal limits  ?APTT  ?PROTIME-INR  ?TROPONIN I (HIGH SENSITIVITY)  ? ? ? ?EKG ? ?Interpreted by me ?Sinus tachycardia, rate of 109.  Normal axis and intervals.  Poor R wave progression.  Normal ST segments and T waves.  1 PVC on the strip. ? ? ?RADIOLOGY ?Chest x-ray viewed interpreted by me, appears normal without pulmonary edema pleural effusion pneumothorax or pneumonia.  Radiology report reviewed. ? ?CT angiogram chest pending ? ? ? ?PROCEDURES: ? ?Critical Care performed: Yes, see critical care procedure note(s) ? ?.Critical Care ?Performed by: 02/26/22, MD ?Authorized by: Sharman Cheek, MD  ? ?Critical care provider statement:  ?  Critical care time (minutes):  35 ?  Critical care time was exclusive of:  Separately billable procedures and treating other patients ?  Critical care was necessary to treat or prevent imminent or life-threatening deterioration of the following conditions:  Circulatory failure and cardiac failure ?  Critical care was time spent personally by me on the following activities:  Development of treatment plan with patient or surrogate, discussions with consultants, evaluation of patient's response to treatment, examination of patient, obtaining history from patient or surrogate, ordering and performing treatments and interventions, ordering and review of laboratory studies, ordering and review of radiographic studies, pulse oximetry, re-evaluation of patient's condition and review of old charts ? ? ?MEDICATIONS ORDERED IN ED: ?Medications  ?albuterol (VENTOLIN HFA) 108 (90 Base) MCG/ACT inhaler 2 puff (has no administration  in time range)  ?heparin bolus via infusion 4,000 Units (has no administration in time range)  ?heparin ADULT infusion 100 units/mL (25000 units/260mL) (has no administration in time range)  ?iohexol (OMNIPAQUE) 350 MG/ML injection 100 mL (has no administration in time range)  ? ? ? ?IMPRESSION / MDM / ASSESSMENT AND PLAN / ED COURSE  ?I reviewed the triage vital signs and the nursing notes. ?             ?               ? ?Differential diagnosis includes, but is not limited to, non-STEMI/unstable angina, pulmonary embolism, anemia, dehydration, pneumonia ? ?**The patient is on the cardiac monitor to evaluate for evidence of arrhythmia and/or significant heart rate changes.**} ? ?Patient presents with dyspnea on exertion associated with near syncope.  Due to age, comorbidities, obesity, he will need to be admitted for cardiac work-up, but first obtaining CT angiogram to rule out PE.  We will start heparin infusion.  Currently no chest pain or significant shortness of breath while at rest in the ED.  Blood pressure is normal. ?  ? ? ?FINAL CLINICAL IMPRESSION(S) / ED DIAGNOSES  ? ?Final diagnoses:  ?DOE (dyspnea on exertion)  ?Elevated troponin  ? ? ? ?Rx / DC Orders  ? ?ED Discharge Orders   ? ? None  ? ?  ? ? ? ?Note:  This document was prepared using Dragon voice recognition software and may include unintentional dictation errors. ?  ?Sharman Cheek, MD ?02/24/22 1530 ? ?

## 2022-02-24 NOTE — ED Notes (Signed)
Pt moved back to RM 10 from room 34, this RN will resume care once again  ?

## 2022-02-24 NOTE — Assessment & Plan Note (Addendum)
Continue Abilify, Pristiq and esketamine ?

## 2022-02-25 ENCOUNTER — Inpatient Hospital Stay (HOSPITAL_COMMUNITY)
Admit: 2022-02-25 | Discharge: 2022-02-25 | Disposition: A | Discharge status: Home / Self Care | Payer: Medicare Other | Attending: Vascular Surgery | Admitting: Vascular Surgery

## 2022-02-25 ENCOUNTER — Encounter
Admission: EM | Disposition: A | Discharge status: Home / Self Care | Payer: Self-pay | Source: Home / Self Care | Attending: Internal Medicine

## 2022-02-25 DIAGNOSIS — F32A Depression, unspecified: Secondary | ICD-10-CM

## 2022-02-25 DIAGNOSIS — I2609 Other pulmonary embolism with acute cor pulmonale: Secondary | ICD-10-CM

## 2022-02-25 DIAGNOSIS — I10 Essential (primary) hypertension: Secondary | ICD-10-CM

## 2022-02-25 DIAGNOSIS — F10939 Alcohol use, unspecified with withdrawal, unspecified: Secondary | ICD-10-CM

## 2022-02-25 DIAGNOSIS — I480 Paroxysmal atrial fibrillation: Secondary | ICD-10-CM | POA: Diagnosis not present

## 2022-02-25 DIAGNOSIS — E669 Obesity, unspecified: Secondary | ICD-10-CM | POA: Diagnosis not present

## 2022-02-25 DIAGNOSIS — I2602 Saddle embolus of pulmonary artery with acute cor pulmonale: Secondary | ICD-10-CM

## 2022-02-25 DIAGNOSIS — I5022 Chronic systolic (congestive) heart failure: Secondary | ICD-10-CM

## 2022-02-25 DIAGNOSIS — E11649 Type 2 diabetes mellitus with hypoglycemia without coma: Secondary | ICD-10-CM

## 2022-02-25 DIAGNOSIS — F10930 Alcohol use, unspecified with withdrawal, uncomplicated: Secondary | ICD-10-CM | POA: Diagnosis not present

## 2022-02-25 DIAGNOSIS — E1159 Type 2 diabetes mellitus with other circulatory complications: Secondary | ICD-10-CM

## 2022-02-25 DIAGNOSIS — I2699 Other pulmonary embolism without acute cor pulmonale: Secondary | ICD-10-CM

## 2022-02-25 HISTORY — PX: PULMONARY THROMBECTOMY: CATH118295

## 2022-02-25 LAB — BASIC METABOLIC PANEL
Anion gap: 7 (ref 5–15)
BUN: 20 mg/dL (ref 8–23)
CO2: 27 mmol/L (ref 22–32)
Calcium: 8.5 mg/dL — ABNORMAL LOW (ref 8.9–10.3)
Chloride: 102 mmol/L (ref 98–111)
Creatinine, Ser: 1.27 mg/dL — ABNORMAL HIGH (ref 0.61–1.24)
GFR, Estimated: >60 mL/min (ref >60)
Glucose, Bld: 99 mg/dL (ref 70–99)
Potassium: 3.9 mmol/L (ref 3.5–5.1)
Sodium: 136 mmol/L (ref 135–145)

## 2022-02-25 LAB — CBC
HCT: 42.5 % (ref 39.0–52.0)
Hemoglobin: 14.7 g/dL (ref 13.0–17.0)
MCH: 34.8 pg — ABNORMAL HIGH (ref 26.0–34.0)
MCHC: 34.6 g/dL (ref 30.0–36.0)
MCV: 100.7 fL — ABNORMAL HIGH (ref 80.0–100.0)
Platelets: 152 10*3/uL (ref 150–400)
RBC: 4.22 MIL/uL (ref 4.22–5.81)
RDW: 12.1 % (ref 11.5–15.5)
WBC: 10.5 10*3/uL (ref 4.0–10.5)
nRBC: 0 % (ref 0.0–0.2)

## 2022-02-25 LAB — HEPARIN LEVEL (UNFRACTIONATED)
Heparin Unfractionated: 0.35 IU/mL (ref 0.30–0.70)
Heparin Unfractionated: 0.16 IU/mL — ABNORMAL LOW (ref 0.30–0.70)

## 2022-02-25 LAB — CBG MONITORING, ED: Glucose-Capillary: 88 mg/dL (ref 70–99)

## 2022-02-25 LAB — MAGNESIUM: Magnesium: 1.6 mg/dL — ABNORMAL LOW (ref 1.7–2.4)

## 2022-02-25 SURGERY — PULMONARY THROMBECTOMY
Anesthesia: Moderate Sedation | Laterality: Bilateral

## 2022-02-25 MED ORDER — TEMAZEPAM 15 MG PO CAPS
15.0000 mg | ORAL_CAPSULE | Freq: Every evening | ORAL | Status: DC | PRN
  Administered 2022-02-25 (×2): 15 mg via ORAL
  Filled 2022-02-25 (×2): qty 1

## 2022-02-25 MED ORDER — ALTEPLASE 1 MG/ML SYRINGE FOR VASCULAR PROCEDURE
INTRAMUSCULAR | Status: DC | PRN
  Administered 2022-02-25 (×2): 5 mg

## 2022-02-25 MED ORDER — HEPARIN BOLUS VIA INFUSION
2750.0000 [IU] | Freq: Once | INTRAVENOUS | Status: AC
Start: 2022-02-25 — End: 2022-02-25
  Administered 2022-02-25: 2750 [IU] via INTRAVENOUS
  Filled 2022-02-25: qty 2750

## 2022-02-25 MED ORDER — MIDAZOLAM HCL 5 MG/5ML IJ SOLN
INTRAMUSCULAR | Status: AC
  Filled 2022-02-25: qty 5

## 2022-02-25 MED ORDER — LORAZEPAM 2 MG/ML IJ SOLN
1.0000 mg | INTRAMUSCULAR | Status: DC | PRN
  Administered 2022-02-25: 1 mg via INTRAVENOUS

## 2022-02-25 MED ORDER — FENTANYL CITRATE PF 50 MCG/ML IJ SOSY
PREFILLED_SYRINGE | INTRAMUSCULAR | Status: AC
  Filled 2022-02-25: qty 2

## 2022-02-25 MED ORDER — THIAMINE HCL 100 MG PO TABS
100.0000 mg | ORAL_TABLET | Freq: Every day | ORAL | Status: DC
  Administered 2022-02-26: 100 mg via ORAL
  Filled 2022-02-25 (×3): qty 1

## 2022-02-25 MED ORDER — CEFAZOLIN SODIUM-DEXTROSE 1-4 GM/50ML-% IV SOLN
INTRAVENOUS | Status: DC | PRN
  Administered 2022-02-25: 2 g via INTRAVENOUS

## 2022-02-25 MED ORDER — ADULT MULTIVITAMIN W/MINERALS CH
1.0000 | ORAL_TABLET | Freq: Every day | ORAL | Status: DC
  Administered 2022-02-25 – 2022-02-26 (×2): 1 via ORAL
  Filled 2022-02-25 (×3): qty 1

## 2022-02-25 MED ORDER — LORAZEPAM 1 MG PO TABS
1.0000 mg | ORAL_TABLET | ORAL | Status: DC | PRN
  Administered 2022-02-25: 1 mg via ORAL
  Filled 2022-02-25: qty 1

## 2022-02-25 MED ORDER — LACTATED RINGERS IV SOLN: INTRAVENOUS | Status: DC

## 2022-02-25 MED ORDER — LORAZEPAM 2 MG/ML IJ SOLN
INTRAMUSCULAR | Status: AC
  Filled 2022-02-25: qty 1

## 2022-02-25 MED ORDER — MAGNESIUM SULFATE 2 GM/50ML IV SOLN
2.0000 g | Freq: Once | INTRAVENOUS | Status: AC
Start: 2022-02-25 — End: 2022-02-25
  Administered 2022-02-25: 2 g via INTRAVENOUS
  Filled 2022-02-25: qty 50

## 2022-02-25 MED ORDER — MIDAZOLAM HCL 2 MG/2ML IJ SOLN
INTRAMUSCULAR | Status: DC | PRN
  Administered 2022-02-25 (×3): 1 mg via INTRAVENOUS

## 2022-02-25 MED ORDER — FENTANYL CITRATE (PF) 100 MCG/2ML IJ SOLN
INTRAMUSCULAR | Status: DC | PRN
  Administered 2022-02-25 (×2): 50 ug via INTRAVENOUS

## 2022-02-25 MED ORDER — DOCUSATE SODIUM 100 MG PO CAPS
100.0000 mg | ORAL_CAPSULE | Freq: Two times a day (BID) | ORAL | Status: DC
  Filled 2022-02-25 (×2): qty 1

## 2022-02-25 MED ORDER — HEPARIN SODIUM (PORCINE) 1000 UNIT/ML IJ SOLN
INTRAMUSCULAR | Status: AC
  Filled 2022-02-25: qty 10

## 2022-02-25 MED ORDER — HEPARIN (PORCINE) 25000 UT/250ML-% IV SOLN
1850.0000 [IU]/h | INTRAVENOUS | Status: DC
  Administered 2022-02-25: 1500 [IU]/h via INTRAVENOUS
  Administered 2022-02-26: 1850 [IU]/h via INTRAVENOUS
  Filled 2022-02-25: qty 250

## 2022-02-25 MED ORDER — THIAMINE HCL 100 MG/ML IJ SOLN
100.0000 mg | Freq: Every day | INTRAMUSCULAR | Status: DC
  Administered 2022-02-25: 100 mg via INTRAVENOUS
  Filled 2022-02-25: qty 1

## 2022-02-25 MED ORDER — FOLIC ACID 1 MG PO TABS
1.0000 mg | ORAL_TABLET | Freq: Every day | ORAL | Status: DC
  Administered 2022-02-25 – 2022-02-26 (×2): 1 mg via ORAL
  Filled 2022-02-25 (×2): qty 1

## 2022-02-25 SURGICAL SUPPLY — 18 items
CANISTER PENUMBRA ENGINE (MISCELLANEOUS) ×1 IMPLANT
CANNULA 5F STIFF (CANNULA) ×1 IMPLANT
CATH ANGIO 5F PIGTAIL 100CM (CATHETERS) ×1 IMPLANT
CATH INDIGO 12XTORQ 100 (CATHETERS) ×1 IMPLANT
CATH INDIGO SEP 12 (CATHETERS) ×1 IMPLANT
CATH INFINITI JR4 5F (CATHETERS) ×1 IMPLANT
COVER PROBE U/S 5X48 (MISCELLANEOUS) ×1 IMPLANT
DEVICE SAFEGUARD 24CM (GAUZE/BANDAGES/DRESSINGS) ×1 IMPLANT
DEVICE TORQUE .025-.038 (MISCELLANEOUS) ×1 IMPLANT
GAUZE SPONGE 4X4 12PLY STRL (GAUZE/BANDAGES/DRESSINGS) ×1 IMPLANT
GLIDEWIRE ANGLED SS 035X260CM (WIRE) ×1 IMPLANT
PACK ANGIOGRAPHY (CUSTOM PROCEDURE TRAY) ×2 IMPLANT
SHEATH PINNACLE 11FRX10 (SHEATH) ×1 IMPLANT
SUT PROLENE 0 CT 1 30 (SUTURE) ×1 IMPLANT
SYR MEDRAD MARK 7 150ML (SYRINGE) ×1 IMPLANT
TUBING CONTRAST HIGH PRESS 72 (TUBING) ×1 IMPLANT
WIRE AMPLATZ SSTIFF .035X260CM (WIRE) ×1 IMPLANT
WIRE GUIDERIGHT .035X150 (WIRE) ×1 IMPLANT

## 2022-02-25 NOTE — ED Notes (Signed)
Pt appears to be sleeping, even RR, unlabored, NAD noted, safety in place, bed in lowest position, side rails up x2, will continue to monitor  ?

## 2022-02-25 NOTE — ED Notes (Signed)
Pt appears to be sleeping, even RR, unlabored, NAD noted, safety in place, bed in lowest position, side rails up x2, will continue to monitor, pt remains on continuous cardiac monitoring  ?

## 2022-02-25 NOTE — Assessment & Plan Note (Addendum)
No signs of withdrawal currently.  Advised to discontinue alcohol.  Thiamine prescribed upon discharge. ?

## 2022-02-25 NOTE — ED Notes (Signed)
Pt continues to watch, NAD noted, no needs/concerns voiced  ?

## 2022-02-25 NOTE — OR Nursing (Signed)
Noted tremor of hands, he said it hap[pens at home. He admits it might be due to fact he hasnt had a drink in a few days. He reports drinking 3-4 drinks 3-4 days a week, beer and liquor.  ?

## 2022-02-25 NOTE — ED Notes (Signed)
Pt request medicaiton to help him sleep, pt reports unable to sleep d/t "strange" environment that is not his home. Reports takes RESTORIL 15 MG capsule PRN at home for sleep. Secure message sent to Dr. Joylene Igo to request sleep aide for pt. Waiting response.  ? ?

## 2022-02-25 NOTE — Op Note (Signed)
[facility] ? Percutaneous Study/Intervention Procedural Note ? ? ?Date of Surgery: 02/25/2022,12:09 PM ? ?Surgeon:Jermain Curt, Latina Craver  ? ?Pre-operative Diagnosis: Symptomatic pulmonary emboli with right heart strain and hypoxia ? ?Post-operative diagnosis:  Same ? ?Procedure(s) Performed: ? 1.  Selective injection right subsegmental pulmonary arteries; right upper lobe, middle lobe and lower lobe pulmonary arteries. ? 2.  Selective injection left subsegmental pulmonary arteries; left upper lobe and left lower lobe pulmonary arteries ? 3.  Infusion of TPA for thrombolysis ? 4.  Mechanical thrombectomy bilateral lobar pulmonary arteries for removal of pulmonary emboli using the Penumbra CAT 8 thrombectomy catheter. ?  ? ?Anesthesia: Conscious sedation was administered under my direct supervision by the interventional radiology RN. IV Versed plus fentanyl were utilized. Continuous ECG, pulse oximetry and blood pressure was monitored throughout the entire procedure.  Conscious sedation was administered for a total of 38 minutes. ? ?Sheath: 11 French 11 cm Pinnacle sheath antegrade right common femoral vein ? ?Contrast: 75 cc  ? ?Fluoroscopy Time: 10.1 minutes ? ?Indications:  Patient presented to the hospital with hypoxia and shortness of breath. The patient is unable to get out of bed and transfer to a chair without increased dyspnea. The patient's O2 saturations off nasal cannula are in the 80%. CT angiogram demonstrated bilateral pulmonary emboli associated with significant right heart strain. Given the long-term sequela and the patient's symptomatic condition the risks and benefits for angiography with thrombectomy are reviewed all questions are answered, the patient agrees to proceed. ? ?Procedure:  Dwayne Delair Thompsonis a 66 y.o. male who was identified and appropriate procedural time out was performed.  The patient was then placed supine on the table and prepped and draped in the usual  sterile fashion.  Ultrasound was used to evaluate the right common femoral vein.  It was compressible indicating it is patent .  A digital ultrasound image was acquired for the permanent record.  A micropuncture needle was used to access the right common femoral vein under direct ultrasound guidance.  A microwire was then advanced under fluoroscopic guidance followed by micro-sheath.  A 0.035 J wire was advanced without resistance and a 5Fr sheath was placed.   ? ?2000 units of heparin was then given and allowed to circulate. ? ?TPA was reconstituted and delivered onto the table. A total of 10 milligrams of TPA was utilized.  5 mg was administered on the left side and 5 mg was administered on the right side. This was then allowed to dwell for 20-30 minutes. ? ?The J-wire and pigtail catheter was advanced up to the right atrium where a bolus injection contrast was used to demonstrate the pulmonary artery outflow.  Stiff angled Glidewire was then exchanged for the J-wire and the pigtail catheter was exchanged for JR4 catheter. Using the combination of the stiff angled Glidewire and the JR4 catheter the pulmonary outflow track was selected. ? ?The left main pulmonary artery was evaluated first.  An Amplatz wire was then exchanged for the stiff angle Glidewire and the JR4 catheter was exchanged back to the pigtail catheter.  Then with the catheter in the left pulmonary artery bolus injection contrast was utilized to demonstrate the thrombus as well as the segmental pulmonary artery vasculature. This demonstrated thrombus in the distal left main pulmonary artery extending into the left upper lobe artery as well as the left lower lobe artery and noting that it was near occlusive.  The pigtail catheter was then advanced out so that it was  positioned within the thrombus and 5 milligrams of TPA were infused directly into the clot. ? ?After an appropriate dwell time the Amplatz wire was reintroduced through the pigtail  catheter and the pigtail catheter removed.  The Penumbra Cat 8 extra torque catheter was then advanced into the thrombus in the left lower lobe pulmonary artery.  Hand-injection contrast was used to verify the positioning and evaluate the distal anatomy.  Mechanical aspiration was performed using the CAT 8 catheter and a separator.  After multiple passes the catheter was then repositioned into the left upper lobe pulmonary artery and again hand-injection contrast was performed to verify positioning and evaluate the distal anatomy.  Multiple passes were made using mechanical aspiration in association with a separator.  Once there was free flow of blood from both lobar arteries the catheter was repositioned to the left main pulmonary artery.  Hand-injection contrast was then performed to give an assessment of the left pulmonary vasculature and the effectiveness of thrombectomy. ? ?Satisfied with the thrombectomy on the left, I then used the penumbra CAT 8 device as well as a Glidewire and an angled catheter to select the right main pulmonary artery ? ?A select catheter was then advanced over the Amplatz wire into the distal right main pulmonary artery and hand-injection confirmed the thrombus. 5 mg of tPA was then injected directly into the thrombus within the right distal main pulmonary artery.  After an appropriate dwell time the Amplatz wire was reintroduced through the select catheter and the select catheter removed.  The Penumbra Cat 8 extra torque catheter was then advanced into the thrombus in the right lower lobe pulmonary artery.  Hand-injection contrast was used to verify positioning and evaluate the distal anatomy.  Mechanical aspiration was performed using the CAT 8 catheter and a separator.  After multiple passes the catheter was then repositioned into the right middle lobe pulmonary artery and again hand-injection contrast was performed to verify position and evaluate the distal anatomy.  Multiple passes  were made using mechanical aspiration in association with a separator.  Lastly, using a combination of the separator catheter and Glidewire the CAT 8 device was negotiated into the right upper lobe pulmonary artery.  Hand-injection of contrast was used to verify positioning and evaluate the distal anatomy.  Multiple passes were then performed using the separator with the CAT 8 penumbra catheter.  Once there was free flow of blood from all 3 lobar arteries the catheter was repositioned to the right main pulmonary artery.  Hand-injection contrast was then used to give an assessment of the effectiveness of thrombectomy. ? ?Pigtail catheter was then reintroduced over the Amplatz wire after removing the penumbra catheter and positioned in the pulmonary outflow tract.  A bolus injection of contrast was then used to create a final image of the pulmonary vasculature. ? ?After review these images the catheter and sheath were removed and pressure held. There were no immediate complications. ? ?Findings:  ? Right pulmonary artery: Initial imaging of the right pulmonary artery demonstrated occlusive thrombus in the trunk of the right lower and middle lobes extending into the right lower lobar and right middle lobar arteries.  Thrombus was also noted in the right upper lobar artery.  Following mechanical thrombectomy where each lobar artery as well as the segmental arteries were selected individually there is total resolution of thrombus in the right upper lobe as well as the right middle lobe arteries.  More than 80% of the thrombus noted in the right lower  lobe artery is been removed as well. ? Left pulmonary: Initial imaging demonstrates occlusive thrombus in the left lower lobe pulmonary artery extending distally over approximately 6 to 8 cm.  There is also occlusive thrombus in the left upper lobe pulmonary artery distally.  Following mechanical thrombectomy where the upper lobe artery was selected as well as the left lower  lobe artery extending into the segmental branches yielded resolution of the thrombus in the lower lobe lung field with less than 20% residual's thrombus in the left upper lobe artery. ? ?SUMMARY: ?Successf

## 2022-02-25 NOTE — Consult Note (Signed)
? ?@LOGO @ ? ? ?MRN : [id number] ? ?Dwayne James is a 66 y.o. (06/13/1956) male who presents with chief complaint of trouble breathing again. ? ?History of Present Illness:  ? ?I am asked to evaluate the patient by Dr. Francine Graven. ? ?The patient is a 66 year old gentleman who presented to the emergency room yesterday afternoon for the acute onset of shortness of breath.  He was evaluated and subsequently underwent CT angiography which demonstrated a extremely large saddle pulmonary embolism.  He was started on heparin and I was asked to evaluate for possible thrombectomy. ? ?Of note the patient is status post pulmonary thrombectomy October 16, 2018.    He completed a year of anticoagulation and has been off blood thinner since. ? ?Patient estimates that this has been going on for approximately 3 days he considers it to be very severe since minimal exertion causes him to be acutely short of breath rest improves his symptoms and relieves the shortness of breath.  He also had 1 episode of syncope which he described as lightheadedness and dizziness during this time. ? ?Current Meds  ?Medication Sig  ? amphetamine-dextroamphetamine (ADDERALL XR) 30 MG 24 hr capsule Take 30 mg by mouth daily.  ? ARIPiprazole (ABILIFY) 15 MG tablet Take 15 mg by mouth daily.  ? aspirin EC 81 MG tablet Take 81 mg by mouth daily.  ? desvenlafaxine (PRISTIQ) 50 MG 24 hr tablet Take 50 mg by mouth daily.  ? Esketamine HCl, 84 MG Dose, 28 MG/DEVICE SOPK Place 1 spray into both nostrils as directed.  ? ezetimibe (ZETIA) 10 MG tablet Take 10 mg by mouth daily.  ? furosemide (LASIX) 20 MG tablet Take 20 mg by mouth as needed for fluid or edema.  ? losartan (COZAAR) 50 MG tablet Take 100 mg by mouth daily.  ? metoprolol succinate (TOPROL-XL) 25 MG 24 hr tablet Take 50 mg by mouth daily.  ? RABEprazole (ACIPHEX) 20 MG tablet Take 1 tablet (20 mg total) by mouth 2 (two) times daily before a meal.  ? Semaglutide,0.25 or 0.5MG /DOS, 2 MG/1.5ML SOPN  Inject 0.5 mg into the skin every Sunday.  ? temazepam (RESTORIL) 15 MG capsule Take 15 mg by mouth at bedtime as needed for sleep.  ? testosterone cypionate (DEPOTESTOSTERONE CYPIONATE) 200 MG/ML injection Inject 100 mg into the muscle every Sunday.  ? ? ?Past Medical History:  ?Diagnosis Date  ? Atrial fibrillation ([place])   ? COPD (chronic obstructive pulmonary disease) ([place])   ? GERD (gastroesophageal reflux disease)   ? Hypertension   ? Prediabetes   ? Seizure ([place]) 2009  ? x 1, pt reports it was related to an antidepressant he was taking  ? ? ?Past Surgical History:  ?Procedure Laterality Date  ? ANKLE ARTHROSCOPY Left 2001  ? ATRIAL FIBRILLATION ABLATION  04/2020  ? CARDIOVERSION    ? x 2  ? COLONOSCOPY WITH PROPOFOL N/A 06/17/2021  ? Procedure: COLONOSCOPY WITH PROPOFOL;  Surgeon: Lin Landsman, MD;  Location: [facility];  Service: Endoscopy;  Laterality: N/A;  ? ESOPHAGOGASTRODUODENOSCOPY (EGD) WITH PROPOFOL N/A 08/11/2021  ? Procedure: ESOPHAGOGASTRODUODENOSCOPY (EGD) WITH PROPOFOL;  Surgeon: Lin Landsman, MD;  Location: [facility] ENDOSCOPY;  Service: Gastroenterology;  Laterality: N/A;  ? KNEE ARTHROSCOPY Right 2009  ? NISSEN FUNDOPLICATION  [id number]  ? PULMONARY THROMBECTOMY N/A 10/16/2018  ? Procedure: PULMONARY THROMBECTOMY;  Surgeon: Katha Cabal, MD;  Location: [facility];  Service: Cardiovascular;  Laterality: N/A;  ? ? ?Social History ?Social  History  ? ?Tobacco Use  ? Smoking status: Former  ?  Types: Cigarettes  ?  Quit date: 05/28/2004  ?  Years since quitting: 17.7  ?  Passive exposure: Past  ? Smokeless tobacco: Current  ?  Types: Chew  ?Vaping Use  ? Vaping Use: Never used  ?Substance Use Topics  ? Alcohol use: Yes  ?  Comment: 2-3 drinks 4 days a week (liquor, beer, & wine)  ? Drug use: No  ? ? ?Family History ?Family History  ?Problem Relation Age of Onset  ? Heart disease Father   ? COPD Father   ? Emphysema Father   ? Stroke Father   ? ? ?No Known  Allergies ? ? ?REVIEW OF SYSTEMS (Negative unless checked) ? ?Constitutional: [] Weight loss  [] Fever  [] Chills ?Cardiac: [] Chest pain   [] Chest pressure   [] Palpitations   [] Shortness of breath when laying flat   [] Shortness of breath with exertion. ?Vascular:  [] Pain in legs with walking   [x] Pain in legs at rest  [] History of DVT   [] Phlebitis   [x] Swelling in legs   [] Varicose veins   [] Non-healing ulcers ?Pulmonary:   [] Uses home oxygen   [] Productive cough   [] Hemoptysis   [] Wheeze  [] COPD   [] Asthma ?Neurologic:  [] Dizziness   [] Seizures   [] History of stroke   [] History of TIA  [] Aphasia   [] Vissual changes   [] Weakness or numbness in arm   [] Weakness or numbness in leg ?Musculoskeletal:   [] Joint swelling   [] Joint pain   [] Low back pain ?Hematologic:  [] Easy bruising  [] Easy bleeding   [] Hypercoagulable state   [] Anemic ?Gastrointestinal:  [] Diarrhea   [] Vomiting  [] Gastroesophageal reflux/heartburn   [] Difficulty swallowing. ?Genitourinary:  [] Chronic kidney disease   [] Difficult urination  [] Frequent urination   [] Blood in urine ?Skin:  [] Rashes   [] Ulcers  ?Psychological:  [] History of anxiety   []  History of major depression. ? ?Physical Examination ? ?Vitals:  ? 02/25/22 0500 02/25/22 0600 02/25/22 0800 02/25/22 0949  ?BP: 109/86 115/84 126/81 (!) 117/92  ?Pulse: 89 66 92   ?Resp: 19 18 16 17   ?Temp:      ?TempSrc:      ?SpO2: 93% 94% 98% 92%  ?Weight:      ?Height:      ? ?Body mass index is 35.73 kg/m?. ?Gen: WD/WN, NAD ?Head: [place]/AT, No temporalis wasting.  ?Ear/Nose/Throat: Hearing grossly intact, nares w/o erythema or drainage, pinna without lesions ?Eyes: PER, EOMI, sclera nonicteric.  ?Neck: Supple, no gross masses.  No JVD.  ?Pulmonary:  Good air movement, no audible wheezing, no use of accessory muscles.  ?Cardiac: RRR, precordium not hyperdynamic. ?Vascular:  scattered varicosities present bilaterally.  Mild venous stasis changes to the legs bilaterally.  1+ soft pitting edema  ?Vessel Right  Left  ?Radial Palpable Palpable  ?Gastrointestinal: soft, non-distended. No guarding/no peritoneal signs.  ?Musculoskeletal: M/S 5/5 throughout.  No deformity.  ?Neurologic: CN 2-12 intact. Pain and light touch intact in extremities.  Symmetrical.  Speech is fluent. Motor exam as listed above. ?Psychiatric: Judgment intact, Mood & affect appropriate for pt's clinical situation. ?Dermatologic: Venous rashes no ulcers noted.  No changes consistent with cellulitis. ?Lymph : No lichenification or skin changes of chronic lymphedema. ? ?CBC ?Lab Results  ?Component Value Date  ? WBC 10.5 02/25/2022  ? HGB 14.7 02/25/2022  ? HCT 42.5 02/25/2022  ? MCV 100.7 (H) 02/25/2022  ? PLT 152 02/25/2022  ? ? ?BMET ?   ?  Component Value Date/Time  ? NA 136 02/25/2022 0428  ? K 3.9 02/25/2022 0428  ? CL 102 02/25/2022 0428  ? CO2 27 02/25/2022 0428  ? GLUCOSE 99 02/25/2022 0428  ? BUN 20 02/25/2022 0428  ? CREATININE 1.27 (H) 02/25/2022 0428  ? CALCIUM 8.5 (L) 02/25/2022 0428  ? GFRNONAA >60 02/25/2022 0428  ? GFRAA >60 10/16/2018 0833  ? ?Estimated Creatinine Clearance: 67.7 mL/min (A) (by C-G formula based on SCr of 1.27 mg/dL (H)). ? ?COAG ?Lab Results  ?Component Value Date  ? INR 1.2 02/24/2022  ? INR 1.07 10/16/2018  ? ? ?Radiology ?DG Chest 2 View ? ?Result Date: 02/24/2022 ?CLINICAL DATA:  Shortness of breath EXAM: CHEST - 2 VIEW COMPARISON:  10/16/2018 FINDINGS: The heart size and mediastinal contours are within normal limits. No focal airspace consolidation, pleural effusion, or pneumothorax. The visualized skeletal structures are unremarkable. IMPRESSION: No active cardiopulmonary disease. Electronically Signed   By: Davina Poke D.O.   On: 02/24/2022 14:00  ? ?CT Angio Chest PE W and/or Wo Contrast ? ?Result Date: 02/24/2022 ?CLINICAL DATA:  Shortness of breath. EXAM: CT ANGIOGRAPHY CHEST WITH CONTRAST TECHNIQUE: Multidetector CT imaging of the chest was performed using the standard protocol during bolus administration of  intravenous contrast. Multiplanar CT image reconstructions and MIPs were obtained to evaluate the vascular anatomy. RADIATION DOSE REDUCTION: This exam was performed according to the departmental dose-optimization pr

## 2022-02-25 NOTE — Interval H&P Note (Signed)
History and Physical Interval Note: ? ?02/25/2022 ?11:24 AM ? ?Dwayne James  has presented today for surgery, with the diagnosis of Bilateral PE.  The various methods of treatment have been discussed with the patient and family. After consideration of risks, benefits and other options for treatment, the patient has consented to  Procedure(s): ?PULMONARY THROMBECTOMY (Bilateral) as a surgical intervention.  The patient's history has been reviewed, patient examined, no change in status, stable for surgery.  I have reviewed the patient's chart and labs.  Questions were answered to the patient's satisfaction.   ? ? ?[name] ? ? ?

## 2022-02-25 NOTE — H&P (View-Only) (Signed)
? ?@LOGO@ ? ? ?MRN : [id number] ? ?Dwayne James is a 66 y.o. (12/03/1955) male who presents with chief complaint of trouble breathing again. ? ?History of Present Illness:  ? ?I am asked to evaluate the patient by Dr. Agbata. ? ?The patient is a 66-year-old gentleman who presented to the emergency room yesterday afternoon for the acute onset of shortness of breath.  He was evaluated and subsequently underwent CT angiography which demonstrated a extremely large saddle pulmonary embolism.  He was started on heparin and I was asked to evaluate for possible thrombectomy. ? ?Of note the patient is status post pulmonary thrombectomy October 16, 2018.    He completed a year of anticoagulation and has been off blood thinner since. ? ?Patient estimates that this has been going on for approximately 3 days he considers it to be very severe since minimal exertion causes him to be acutely short of breath rest improves his symptoms and relieves the shortness of breath.  He also had 1 episode of syncope which he described as lightheadedness and dizziness during this time. ? ?Current Meds  ?Medication Sig  ? amphetamine-dextroamphetamine (ADDERALL XR) 30 MG 24 hr capsule Take 30 mg by mouth daily.  ? ARIPiprazole (ABILIFY) 15 MG tablet Take 15 mg by mouth daily.  ? aspirin EC 81 MG tablet Take 81 mg by mouth daily.  ? desvenlafaxine (PRISTIQ) 50 MG 24 hr tablet Take 50 mg by mouth daily.  ? Esketamine HCl, 84 MG Dose, 28 MG/DEVICE SOPK Place 1 spray into both nostrils as directed.  ? ezetimibe (ZETIA) 10 MG tablet Take 10 mg by mouth daily.  ? furosemide (LASIX) 20 MG tablet Take 20 mg by mouth as needed for fluid or edema.  ? losartan (COZAAR) 50 MG tablet Take 100 mg by mouth daily.  ? metoprolol succinate (TOPROL-XL) 25 MG 24 hr tablet Take 50 mg by mouth daily.  ? RABEprazole (ACIPHEX) 20 MG tablet Take 1 tablet (20 mg total) by mouth 2 (two) times daily before a meal.  ? Semaglutide,0.25 or 0.5MG/DOS, 2 MG/1.5ML SOPN  Inject 0.5 mg into the skin every Sunday.  ? temazepam (RESTORIL) 15 MG capsule Take 15 mg by mouth at bedtime as needed for sleep.  ? testosterone cypionate (DEPOTESTOSTERONE CYPIONATE) 200 MG/ML injection Inject 100 mg into the muscle every Sunday.  ? ? ?Past Medical History:  ?Diagnosis Date  ? Atrial fibrillation (HCC)   ? COPD (chronic obstructive pulmonary disease) (HCC)   ? GERD (gastroesophageal reflux disease)   ? Hypertension   ? Prediabetes   ? Seizure (HCC) 2009  ? x 1, pt reports it was related to an antidepressant he was taking  ? ? ?Past Surgical History:  ?Procedure Laterality Date  ? ANKLE ARTHROSCOPY Left 2001  ? ATRIAL FIBRILLATION ABLATION  04/2020  ? CARDIOVERSION    ? x 2  ? COLONOSCOPY WITH PROPOFOL N/A 06/17/2021  ? Procedure: COLONOSCOPY WITH PROPOFOL;  Surgeon: Vanga, Rohini Reddy, MD;  Location: [facility];  Service: Endoscopy;  Laterality: N/A;  ? ESOPHAGOGASTRODUODENOSCOPY (EGD) WITH PROPOFOL N/A 08/11/2021  ? Procedure: ESOPHAGOGASTRODUODENOSCOPY (EGD) WITH PROPOFOL;  Surgeon: Vanga, Rohini Reddy, MD;  Location: [facility];  Service: Gastroenterology;  Laterality: N/A;  ? KNEE ARTHROSCOPY Right 2009  ? NISSEN FUNDOPLICATION  2006  ? PULMONARY THROMBECTOMY N/A 10/16/2018  ? Procedure: PULMONARY THROMBECTOMY;  Surgeon: Marija Calamari G, MD;  Location: [facility];  Service: Cardiovascular;  Laterality: N/A;  ? ? ?Social History ?Social   History  ? ?Tobacco Use  ? Smoking status: Former  ?  Types: Cigarettes  ?  Quit date: 05/28/2004  ?  Years since quitting: 17.7  ?  Passive exposure: Past  ? Smokeless tobacco: Current  ?  Types: Chew  ?Vaping Use  ? Vaping Use: Never used  ?Substance Use Topics  ? Alcohol use: Yes  ?  Comment: 2-3 drinks 4 days a week (liquor, beer, & wine)  ? Drug use: No  ? ? ?Family History ?Family History  ?Problem Relation Age of Onset  ? Heart disease Father   ? COPD Father   ? Emphysema Father   ? Stroke Father   ? ? ?No Known  Allergies ? ? ?REVIEW OF SYSTEMS (Negative unless checked) ? ?Constitutional: []Weight loss  []Fever  []Chills ?Cardiac: []Chest pain   []Chest pressure   []Palpitations   []Shortness of breath when laying flat   []Shortness of breath with exertion. ?Vascular:  []Pain in legs with walking   [x]Pain in legs at rest  []History of DVT   []Phlebitis   [x]Swelling in legs   []Varicose veins   []Non-healing ulcers ?Pulmonary:   []Uses home oxygen   []Productive cough   []Hemoptysis   []Wheeze  []COPD   []Asthma ?Neurologic:  []Dizziness   []Seizures   []History of stroke   []History of TIA  []Aphasia   []Vissual changes   []Weakness or numbness in arm   []Weakness or numbness in leg ?Musculoskeletal:   []Joint swelling   []Joint pain   []Low back pain ?Hematologic:  []Easy bruising  []Easy bleeding   []Hypercoagulable state   []Anemic ?Gastrointestinal:  []Diarrhea   []Vomiting  []Gastroesophageal reflux/heartburn   []Difficulty swallowing. ?Genitourinary:  []Chronic kidney disease   []Difficult urination  []Frequent urination   []Blood in urine ?Skin:  []Rashes   []Ulcers  ?Psychological:  []History of anxiety   [] History of major depression. ? ?Physical Examination ? ?Vitals:  ? 02/25/22 0500 02/25/22 0600 02/25/22 0800 02/25/22 0949  ?BP: 109/86 115/84 126/81 (!) 117/92  ?Pulse: 89 66 92   ?Resp: 19 18 16 17  ?Temp:      ?TempSrc:      ?SpO2: 93% 94% 98% 92%  ?Weight:      ?Height:      ? ?Body mass index is 35.73 kg/m?. ?Gen: WD/WN, NAD ?Head: [place]/AT, No temporalis wasting.  ?Ear/Nose/Throat: Hearing grossly intact, nares w/o erythema or drainage, pinna without lesions ?Eyes: PER, EOMI, sclera nonicteric.  ?Neck: Supple, no gross masses.  No JVD.  ?Pulmonary:  Good air movement, no audible wheezing, no use of accessory muscles.  ?Cardiac: RRR, precordium not hyperdynamic. ?Vascular:  scattered varicosities present bilaterally.  Mild venous stasis changes to the legs bilaterally.  1+ soft pitting edema  ?Vessel Right  Left  ?Radial Palpable Palpable  ?Gastrointestinal: soft, non-distended. No guarding/no peritoneal signs.  ?Musculoskeletal: M/S 5/5 throughout.  No deformity.  ?Neurologic: CN 2-12 intact. Pain and light touch intact in extremities.  Symmetrical.  Speech is fluent. Motor exam as listed above. ?Psychiatric: Judgment intact, Mood & affect appropriate for pt's clinical situation. ?Dermatologic: Venous rashes no ulcers noted.  No changes consistent with cellulitis. ?Lymph : No lichenification or skin changes of chronic lymphedema. ? ?CBC ?Lab Results  ?Component Value Date  ? WBC 10.5 02/25/2022  ? HGB 14.7 02/25/2022  ? HCT 42.5 02/25/2022  ? MCV 100.7 (H) 02/25/2022  ? PLT 152 02/25/2022  ? ? ?BMET ?   ?  Component Value Date/Time  ? NA 136 02/25/2022 0428  ? K 3.9 02/25/2022 0428  ? CL 102 02/25/2022 0428  ? CO2 27 02/25/2022 0428  ? GLUCOSE 99 02/25/2022 0428  ? BUN 20 02/25/2022 0428  ? CREATININE 1.27 (H) 02/25/2022 0428  ? CALCIUM 8.5 (L) 02/25/2022 0428  ? GFRNONAA >60 02/25/2022 0428  ? GFRAA >60 10/16/2018 0833  ? ?Estimated Creatinine Clearance: 67.7 mL/min (A) (by C-G formula based on SCr of 1.27 mg/dL (H)). ? ?COAG ?Lab Results  ?Component Value Date  ? INR 1.2 02/24/2022  ? INR 1.07 10/16/2018  ? ? ?Radiology ?DG Chest 2 View ? ?Result Date: 02/24/2022 ?CLINICAL DATA:  Shortness of breath EXAM: CHEST - 2 VIEW COMPARISON:  10/16/2018 FINDINGS: The heart size and mediastinal contours are within normal limits. No focal airspace consolidation, pleural effusion, or pneumothorax. The visualized skeletal structures are unremarkable. IMPRESSION: No active cardiopulmonary disease. Electronically Signed   By: Nicholas  Plundo D.O.   On: 02/24/2022 14:00  ? ?CT Angio Chest PE W and/or Wo Contrast ? ?Result Date: 02/24/2022 ?CLINICAL DATA:  Shortness of breath. EXAM: CT ANGIOGRAPHY CHEST WITH CONTRAST TECHNIQUE: Multidetector CT imaging of the chest was performed using the standard protocol during bolus administration of  intravenous contrast. Multiplanar CT image reconstructions and MIPs were obtained to evaluate the vascular anatomy. RADIATION DOSE REDUCTION: This exam was performed according to the departmental dose-optimization pr

## 2022-02-25 NOTE — Progress Notes (Signed)
Cross Cover ?Brief episode of bigeminy reported from nursing. Creatinine up 1.27 ?Mag level requested as add on. Started LR fluids at 100.  ?

## 2022-02-25 NOTE — Consult Note (Signed)
ANTICOAGULATION CONSULT NOTE ? ?Pharmacy Consult for Heparin ?Indication: PE ? ?No Known Allergies ? ?Patient Measurements: ?Height: 5\' 8"  (172.7 cm) ?Weight: 106.6 kg (235 lb) ?IBW/kg (Calculated) : 68.4 ?Heparin Dosing Weight: 91.8 kg ? ?Vital Signs: ?BP: 117/77 (04/28 1215) ?Pulse Rate: 95 (04/28 1215) ? ?Labs: ?Recent Labs  ?  02/24/22 ?1325 02/24/22 ?1432 02/24/22 ?1505 02/24/22 ?2231 02/25/22 ?0428  ?HGB 17.0  --   --   --  14.7  ?HCT 49.7  --   --   --  42.5  ?PLT 196  --   --   --  152  ?APTT  --  30  --   --   --   ?LABPROT  --  15.0  --   --   --   ?INR  --  1.2  --   --   --   ?HEPARINUNFRC  --   --  <0.10* 0.40 0.35  ?CREATININE 1.19  --   --   --  1.27*  ?TROPONINIHS 76* 76*  --   --   --   ? ? ? ?Estimated Creatinine Clearance: 67.7 mL/min (A) (by C-G formula based on SCr of 1.27 mg/dL (H)). ? ? ?Medical History: ?Past Medical History:  ?Diagnosis Date  ? Atrial fibrillation (HCC)   ? COPD (chronic obstructive pulmonary disease) (HCC)   ? GERD (gastroesophageal reflux disease)   ? Hypertension   ? Prediabetes   ? Seizure (HCC) 2009  ? x 1, pt reports it was related to an antidepressant he was taking  ? ? ?Medications:  ?No history of chronic anticoagulant use PTA ? ?Assessment: ? 66 y.o. male with a history of depression, GERD, PE, obesity who comes ED complaining of shortness of breath. Pharmacy initially consulted for heparin for ACS/STEMI and orders placed and bolus and infusion already started. Consult now for pulmonary embolism.  ? ?Goal of Therapy:  ?Heparin level 0.3-0.7 units/ml ?Monitor platelets by anticoagulation protocol: Yes ?  ?Plan:  ?Pt underwent thrombectomy. Restart heparin at previous rate of 1500 units/hr starting at 1400 on 4/28. Check heparin level 6 hours after restart. CBC daily while on IV heparin.  ? ?5/28 Brandis Matsuura ?02/25/2022,12:54 PM ? ? ?

## 2022-02-25 NOTE — ED Notes (Signed)
Dr. Renae Gloss at bedside assessing pt and updating pt on plan of care. ?

## 2022-02-25 NOTE — Progress Notes (Signed)
?  Progress Note ? ? ?Patient: Dwayne James WGY:[id number] DOB: 06-Jan-1956 DOA: 02/24/2022     1 ?DOS: the patient was seen and examined on 02/25/2022 ?  ? ? ?Assessment and Plan: ?* Acute saddle pulmonary embolism with acute cor pulmonale (HCC) ?Saddle pulmonary embolism with cor pulmonale.  Patient seen before and after thrombectomy and thrombolysis.  Heparin drip was started on admission and will be restarted at 2 PM today.  Hopefully can switch over to Eliquis tomorrow.  Echocardiogram ordered. ? ?Alcohol withdrawal (HCC) ?Slight tremor seen postprocedure.  Will place on alcohol withdrawal protocol.  We will give IV thiamine. ? ?Paroxysmal atrial fibrillation (HCC) ?I will discontinue aspirin since he will be on Eliquis upon discharge. ? ?Obesity (BMI 35.0-39.9 without comorbidity) ?BMI 35.73 ? ?Hypertension ?Blood pressure is stable ?Toprol held this morning with blood pressure being on the lower side. ? ?Hypomagnesemia ?Replace IV magnesium today ? ?Depression ?Continue Abilify, Cymbalta and esketamine ? ?Chronic systolic CHF (congestive heart failure) (HCC) ?With blood pressure on the lower side holding Lasix, Toprol and ARB. ? ?Diabetes mellitus (HCC) ?Hemoglobin A1c 5.7 we will discontinue sliding scale and fingersticks. ? ? ? ? ?  ? ?Subjective: Patient seen this morning and states he has been short of breath for few days.  No chest pain.  Seen after procedure and was a little bit shaky.  Does drink 4-5 drinks, 5 days a week. ? ?Physical Exam: ?Vitals:  ? 02/25/22 1215 02/25/22 1245 02/25/22 1252 02/25/22 1254  ?BP: 117/77 (!) 87/61 109/79   ?Pulse: 95 96 (!) 101 (!) 106  ?Resp: (!) 25 20 19    ?Temp:      ?TempSrc:      ?SpO2: 96% 98% 98%   ?Weight:      ?Height:      ? ?Physical Exam ?HENT:  ?   Head: Normocephalic.  ?   Mouth/Throat:  ?   Pharynx: No oropharyngeal exudate.  ?Eyes:  ?   General: Lids are normal.  ?   Conjunctiva/sclera: Conjunctivae normal.  ?Cardiovascular:  ?   Rate and Rhythm:  Normal rate and regular rhythm.  ?   Heart sounds: Normal heart sounds, S1 normal and S2 normal.  ?Pulmonary:  ?   Breath sounds: Examination of the right-lower field reveals decreased breath sounds. Examination of the left-lower field reveals decreased breath sounds. Decreased breath sounds present. No wheezing, rhonchi or rales.  ?Abdominal:  ?   Palpations: Abdomen is soft.  ?   Tenderness: There is no abdominal tenderness.  ?Musculoskeletal:  ?   Right lower leg: No swelling.  ?   Left lower leg: No swelling.  ?Skin: ?   General: Skin is warm.  ?   Findings: No rash.  ?Neurological:  ?   Mental Status: He is alert and oriented to person, place, and time.  ?   Comments: Slight tremor  ?  ?Data Reviewed: ?Creatinine 1.27, magnesium 1.6, white blood cell count 10.5, hemoglobin 14.7 ? ?Family Communication: Declined ? ?Disposition: ?Status is: Inpatient ?Remains inpatient appropriate because: And thrombolysis and thrombectomy today for saddle pulmonary embolism ? ?Planned Discharge Destination: Home ? ?Author: ? , MD ?02/25/2022 1:12 PM ? ?For on call review [email or web address].  ?

## 2022-02-25 NOTE — Assessment & Plan Note (Addendum)
Replaced IV magnesium in the hospital course we will give oral magnesium upon discharge. ?

## 2022-02-25 NOTE — Consult Note (Signed)
ANTICOAGULATION CONSULT NOTE ? ?Pharmacy Consult for Heparin ?Indication: PE ? ?No Known Allergies ? ?Patient Measurements: ?Height: 5\' 8"  (172.7 cm) ?Weight: 106.6 kg (235 lb) ?IBW/kg (Calculated) : 68.4 ?Heparin Dosing Weight: 91.8 kg ? ?Vital Signs: ?Temp: 97.8 ?F (36.6 ?C) (04/27 2300) ?Temp Source: Oral (04/27 2300) ?BP: 104/80 (04/28 0400) ?Pulse Rate: 95 (04/28 0400) ? ?Labs: ?Recent Labs  ?  02/24/22 ?1325 02/24/22 ?1432 02/24/22 ?1505 02/24/22 ?2231 02/25/22 ?0428  ?HGB 17.0  --   --   --  14.7  ?HCT 49.7  --   --   --  42.5  ?PLT 196  --   --   --  152  ?APTT  --  30  --   --   --   ?LABPROT  --  15.0  --   --   --   ?INR  --  1.2  --   --   --   ?HEPARINUNFRC  --   --  <0.10* 0.40 0.35  ?CREATININE 1.19  --   --   --  1.27*  ?TROPONINIHS 76* 76*  --   --   --   ? ? ? ?Estimated Creatinine Clearance: 67.7 mL/min (A) (by C-G formula based on SCr of 1.27 mg/dL (H)). ? ? ?Medical History: ?Past Medical History:  ?Diagnosis Date  ? Atrial fibrillation ([place])   ? COPD (chronic obstructive pulmonary disease) ([place])   ? GERD (gastroesophageal reflux disease)   ? Hypertension   ? Prediabetes   ? Seizure ([place]) 2009  ? x 1, pt reports it was related to an antidepressant he was taking  ? ? ?Medications:  ?No history of chronic anticoagulant use PTA ? ?Assessment: ? 66 y.o. male with a history of depression, GERD, PE, obesity who comes ED complaining of shortness of breath. Pharmacy initially consulted for heparin for ACS/STEMI and orders placed and bolus and infusion already started. Consult now for pulmonary embolism.  ? ?Goal of Therapy:  ?Heparin level 0.3-0.7 units/ml ?Monitor platelets by anticoagulation protocol: Yes ?  ?Plan:  ?4/28:  HL @ 0428 = 0.35, therapeutic X 2  ?Will continue pt on current rate and recheck HL on 4/29 with AM labs.  ? ?Nathanuel Cabreja D ?02/25/2022,5:40 AM ? ? ?

## 2022-02-25 NOTE — Consult Note (Signed)
ANTICOAGULATION CONSULT NOTE ? ?Pharmacy Consult for Heparin ?Indication: PE ? ?No Known Allergies ? ?Patient Measurements: ?Height: 5\' 8"  (172.7 cm) ?Weight: 106.6 kg (235 lb) ?IBW/kg (Calculated) : 68.4 ?Heparin Dosing Weight: 91.8 kg ? ?Vital Signs: ?Temp: 97.6 ?F (36.4 ?C) (04/28 1914) ?Temp Source: Oral (04/28 1914) ?BP: 112/79 (04/28 1914) ?Pulse Rate: 96 (04/28 1914) ? ?Labs: ?Recent Labs  ?  02/24/22 ?1325 02/24/22 ?1432 02/24/22 ?1505 02/24/22 ?2231 02/25/22 ?0428 02/25/22 ?2026  ?HGB 17.0  --   --   --  14.7  --   ?HCT 49.7  --   --   --  42.5  --   ?PLT 196  --   --   --  152  --   ?APTT  --  30  --   --   --   --   ?LABPROT  --  15.0  --   --   --   --   ?INR  --  1.2  --   --   --   --   ?HEPARINUNFRC  --   --    < > 0.40 0.35 0.16*  ?CREATININE 1.19  --   --   --  1.27*  --   ?TROPONINIHS 76* 76*  --   --   --   --   ? < > = values in this interval not displayed.  ? ? ? ?Estimated Creatinine Clearance: 67.7 mL/min (A) (by C-G formula based on SCr of 1.27 mg/dL (H)). ? ? ?Medical History: ?Past Medical History:  ?Diagnosis Date  ? Atrial fibrillation (HCC)   ? COPD (chronic obstructive pulmonary disease) (HCC)   ? GERD (gastroesophageal reflux disease)   ? Hypertension   ? Prediabetes   ? Seizure (HCC) 2009  ? x 1, pt reports it was related to an antidepressant he was taking  ? ? ?Medications:  ?No history of chronic anticoagulant use PTA ? ?Assessment: ? 66 y.o. male with a history of depression, GERD, PE, obesity who comes ED complaining of shortness of breath. Pharmacy initially consulted for heparin for ACS/STEMI and orders placed and bolus and infusion already started. Consult now for pulmonary embolism.  ? ?Goal of Therapy:  ?Heparin level 0.3-0.7 units/ml ?Monitor platelets by anticoagulation protocol: Yes ?  ?Plan:  ?4/28:  HL @ 2026 = 0.16, subtherapeutic  ?Will order heparin 2750 units IV X 1 bolus and increase drip rate to 1850 units/hr.  ?Will recheck HL 6 hrs after rate change.   ? ?Doreatha Offer D ?02/25/2022,9:48 PM ? ? ?

## 2022-02-26 ENCOUNTER — Inpatient Hospital Stay: Payer: Medicare Other

## 2022-02-26 DIAGNOSIS — I2602 Saddle embolus of pulmonary artery with acute cor pulmonale: Secondary | ICD-10-CM | POA: Diagnosis not present

## 2022-02-26 DIAGNOSIS — I480 Paroxysmal atrial fibrillation: Secondary | ICD-10-CM | POA: Diagnosis not present

## 2022-02-26 DIAGNOSIS — E669 Obesity, unspecified: Secondary | ICD-10-CM | POA: Diagnosis not present

## 2022-02-26 DIAGNOSIS — F10930 Alcohol use, unspecified with withdrawal, uncomplicated: Secondary | ICD-10-CM | POA: Diagnosis not present

## 2022-02-26 LAB — BASIC METABOLIC PANEL
Anion gap: 7 (ref 5–15)
BUN: 16 mg/dL (ref 8–23)
CO2: 26 mmol/L (ref 22–32)
Calcium: 8.5 mg/dL — ABNORMAL LOW (ref 8.9–10.3)
Chloride: 103 mmol/L (ref 98–111)
Creatinine, Ser: 1.18 mg/dL (ref 0.61–1.24)
GFR, Estimated: >60 mL/min (ref >60)
Glucose, Bld: 122 mg/dL — ABNORMAL HIGH (ref 70–99)
Potassium: 3.3 mmol/L — ABNORMAL LOW (ref 3.5–5.1)
Sodium: 136 mmol/L (ref 135–145)

## 2022-02-26 LAB — HIV ANTIBODY (ROUTINE TESTING W REFLEX): HIV Screen 4th Generation wRfx: NONREACTIVE

## 2022-02-26 LAB — HEPARIN LEVEL (UNFRACTIONATED): Heparin Unfractionated: 0.41 IU/mL (ref 0.30–0.70)

## 2022-02-26 LAB — CBC
HCT: 39.2 % (ref 39.0–52.0)
Hemoglobin: 13.2 g/dL (ref 13.0–17.0)
MCH: 34.9 pg — ABNORMAL HIGH (ref 26.0–34.0)
MCHC: 33.7 g/dL (ref 30.0–36.0)
MCV: 103.7 fL — ABNORMAL HIGH (ref 80.0–100.0)
Platelets: 139 10*3/uL — ABNORMAL LOW (ref 150–400)
RBC: 3.78 MIL/uL — ABNORMAL LOW (ref 4.22–5.81)
RDW: 12.1 % (ref 11.5–15.5)
WBC: 9.5 10*3/uL (ref 4.0–10.5)
nRBC: 0 % (ref 0.0–0.2)

## 2022-02-26 LAB — ECHOCARDIOGRAM COMPLETE
Area-P 1/2: 3.99 cm2
Height: 68 in
S' Lateral: 3.1 cm
Weight: 3760 oz

## 2022-02-26 LAB — MAGNESIUM: Magnesium: 1.9 mg/dL (ref 1.7–2.4)

## 2022-02-26 LAB — PHOSPHORUS: Phosphorus: 4.2 mg/dL (ref 2.5–4.6)

## 2022-02-26 MED ORDER — POTASSIUM CHLORIDE CRYS ER 20 MEQ PO TBCR
40.0000 meq | EXTENDED_RELEASE_TABLET | Freq: Once | ORAL | Status: AC
  Administered 2022-02-26: 40 meq via ORAL
  Filled 2022-02-26: qty 2

## 2022-02-26 MED ORDER — MAGNESIUM OXIDE -MG SUPPLEMENT 400 (240 MG) MG PO TABS: 400.0000 mg | ORAL_TABLET | Freq: Every day | ORAL | 0 refills | Status: AC

## 2022-02-26 MED ORDER — MAGNESIUM OXIDE -MG SUPPLEMENT 400 (240 MG) MG PO TABS
400.0000 mg | ORAL_TABLET | Freq: Every day | ORAL | Status: DC
  Administered 2022-02-26: 400 mg via ORAL
  Filled 2022-02-26: qty 1

## 2022-02-26 MED ORDER — VENLAFAXINE HCL ER 75 MG PO CP24
75.0000 mg | ORAL_CAPSULE | Freq: Every day | ORAL | Status: DC
  Administered 2022-02-26: 75 mg via ORAL
  Filled 2022-02-26: qty 1

## 2022-02-26 MED ORDER — MAGNESIUM OXIDE -MG SUPPLEMENT 400 (240 MG) MG PO TABS: 400.0000 mg | ORAL_TABLET | Freq: Every day | ORAL | 0 refills | Status: DC

## 2022-02-26 MED ORDER — THIAMINE HCL 100 MG PO TABS: 100.0000 mg | ORAL_TABLET | Freq: Every day | ORAL | 0 refills | Status: DC

## 2022-02-26 MED ORDER — RIVAROXABAN (XARELTO) VTE STARTER PACK (15 & 20 MG): ORAL_TABLET | ORAL | 0 refills | Status: DC

## 2022-02-26 MED ORDER — IPRATROPIUM-ALBUTEROL 0.5-2.5 (3) MG/3ML IN SOLN: 3.0000 mL | Freq: Once | RESPIRATORY_TRACT | Status: DC

## 2022-02-26 MED ORDER — RIVAROXABAN 15 MG PO TABS
15.0000 mg | ORAL_TABLET | Freq: Two times a day (BID) | ORAL | Status: DC
  Administered 2022-02-26: 15 mg via ORAL
  Filled 2022-02-26 (×2): qty 1

## 2022-02-26 MED ORDER — RIVAROXABAN 20 MG PO TABS: 20.0000 mg | ORAL_TABLET | Freq: Every day | ORAL | Status: DC

## 2022-02-26 NOTE — Consult Note (Signed)
ANTICOAGULATION CONSULT NOTE ? ?Pharmacy Consult for Heparin ?Indication: PE ? ?No Known Allergies ? ?Patient Measurements: ?Height: 5\' 8"  (172.7 cm) ?Weight: 106.6 kg (235 lb) ?IBW/kg (Calculated) : 68.4 ?Heparin Dosing Weight: 91.8 kg ? ?Vital Signs: ?Temp: 97.9 ?F (36.6 ?C) (04/29 0310) ?Temp Source: Oral (04/29 0310) ?BP: 105/82 (04/29 0310) ?Pulse Rate: 102 (04/29 0310) ? ?Labs: ?Recent Labs  ?  02/24/22 ?1325 02/24/22 ?1432 02/24/22 ?1505 02/25/22 ?0428 02/25/22 ?2026 02/26/22 ?02/28/22  ?HGB 17.0  --   --  14.7  --  13.2  ?HCT 49.7  --   --  42.5  --  39.2  ?PLT 196  --   --  152  --  139*  ?APTT  --  30  --   --   --   --   ?LABPROT  --  15.0  --   --   --   --   ?INR  --  1.2  --   --   --   --   ?HEPARINUNFRC  --   --    < > 0.35 0.16* 0.41  ?CREATININE 1.19  --   --  1.27*  --  1.18  ?TROPONINIHS 76* 76*  --   --   --   --   ? < > = values in this interval not displayed.  ? ? ? ?Estimated Creatinine Clearance: 72.9 mL/min (by C-G formula based on SCr of 1.18 mg/dL). ? ? ?Medical History: ?Past Medical History:  ?Diagnosis Date  ? Atrial fibrillation (HCC)   ? COPD (chronic obstructive pulmonary disease) (HCC)   ? GERD (gastroesophageal reflux disease)   ? Hypertension   ? Prediabetes   ? Seizure (HCC) 2009  ? x 1, pt reports it was related to an antidepressant he was taking  ? ? ?Medications:  ?No history of chronic anticoagulant use PTA ? ?Assessment: ? 66 y.o. male with a history of depression, GERD, PE, obesity who comes ED complaining of shortness of breath. Pharmacy initially consulted for heparin for ACS/STEMI and orders placed and bolus and infusion already started. Consult now for pulmonary embolism.  ? ?Goal of Therapy:  ?Heparin level 0.3-0.7 units/ml ?Monitor platelets by anticoagulation protocol: Yes ?  ?Plan:  ?4/29:  HL @ 0508 = 0.41, therapeutic X 1  ?Will continue pt on current rate and draw confirmation level in 6 hrs @ 1100.  ? ?Sander Speckman D ?02/26/2022,6:46 AM ? ? ?

## 2022-02-26 NOTE — Progress Notes (Signed)
AVS given and reviewed with pt. Medications discussed. All questions answered to satisfaction. Pt verbalized understanding of information given. Pt escorted off the unit with all belongings via wheelchair by staff member.  

## 2022-02-26 NOTE — Plan of Care (Signed)
  Problem: Education: Goal: Knowledge of General Education information will improve Description: Including pain rating scale, medication(s)/side effects and non-pharmacologic comfort measures Outcome: Progressing   Problem: Health Behavior/Discharge Planning: Goal: Ability to manage health-related needs will improve Outcome: Progressing   Problem: Clinical Measurements: Goal: Respiratory complications will improve Outcome: Progressing   Problem: Activity: Goal: Risk for activity intolerance will decrease Outcome: Progressing   Problem: Nutrition: Goal: Adequate nutrition will be maintained Outcome: Progressing   Problem: Pain Managment: Goal: General experience of comfort will improve Outcome: Progressing   Problem: Safety: Goal: Ability to remain free from injury will improve Outcome: Progressing   Problem: Skin Integrity: Goal: Risk for impaired skin integrity will decrease Outcome: Progressing   

## 2022-02-26 NOTE — Discharge Summary (Signed)
?Physician Discharge Summary ?  ?Patient: Dwayne James MRN: [id number] DOB: 11/29/1955  ?Admit date:     02/24/2022  ?Discharge date: 02/26/22  ?Discharge Physician: Alford Highlandichard Hannah Strader  ? ?PCP: Racheal PatchesWoodward, II Fredrick W, MD  ? ?Recommendations at discharge:  ? ?Follow-up PCP 5 days ?Follow-up Dr. Gilda CreaseSchnier in few weeks ? ?Discharge Diagnoses: ?Principal Problem: ?  Acute saddle pulmonary embolism with acute cor pulmonale (HCC) ?Active Problems: ?  Alcohol withdrawal (HCC) ?  Paroxysmal atrial fibrillation (HCC) ?  Obesity (BMI 35.0-39.9 without comorbidity) ?  Hypertension ?  Hypomagnesemia ?  Depression ?  Chronic systolic CHF (congestive heart failure) (HCC) ?  Diabetes mellitus (HCC) ?  Acute respiratory failure with hypoxia (HCC) ? ? ? ?Hospital Course: ?The patient was admitted to the hospital on 02/24/2022 and discharged on 02/26/2022.  The patient came in with shortness of breath.  A CT scan of the chest showed a large saddle pulmonary embolism with extends into both pulmonary arteries and respective lower lobe branches with right heart strain.  Patient was initially started on heparin drip.  The patient was brought to the vascular lab and was infused tPA for thrombolysis and also had mechanical thrombectomy.  Please see operative report by Dr. Gilda CreaseSchnier.  The patient was placed back on heparin drip.  The next day he was placed on Xarelto.  The patient wanted a once a day medication.  I told him that the Xarelto will be twice a day for 21 days then once a day after that.  Ultrasound of the lower extremities was ordered but still pending reading by the radiologist at the time of this dictation.  Patient will follow up with vascular surgery as outpatient.  Since this is the patient's second blood clot he will be on lifelong anticoagulation. ? ?Assessment and Plan: ?* Acute saddle pulmonary embolism with acute cor pulmonale (HCC) ?Saddle pulmonary embolism with cor pulmonale.  Dr. Eduard RouxShaker did a thrombectomy and  thrombolysis on 02/25/2022.  Patient was switched over to Xarelto for anticoagulation.  Since this is a second blood clot he will be on lifelong anticoagulation.  Risk of bleeding explained to patient. ? ?Alcohol withdrawal (HCC) ?No signs of withdrawal currently.  Advised to discontinue alcohol.  Thiamine prescribed upon discharge. ? ?Paroxysmal atrial fibrillation (HCC) ?I will discontinue aspirin since he will be on Eliquis upon discharge. ? ?Obesity (BMI 35.0-39.9 without comorbidity) ?BMI 35.73 ? ?Hypertension ?Blood pressure is stable.  Continue Toprol but hold losartan with blood pressure being on the lower side. ? ?Hypomagnesemia ?Replaced IV magnesium in the hospital course we will give oral magnesium upon discharge. ? ?Depression ?Continue Abilify, Pristiq and esketamine ? ?Chronic systolic CHF (congestive heart failure) (HCC) ?Continue Toprol.  Lasix can be restarted as outpatient.  Hold ARB for right now. ? ?Diabetes mellitus (HCC) ?Hemoglobin A1c 5.7 ? ? ? ? ?  ? ? ?Consultants: Vascular surgery ?Procedures performed: Saddle pulmonary embolus treated with thrombolysis and thrombectomy ?Disposition: Home ?Diet recommendation:  ?Cardiac and Carb modified diet ?DISCHARGE MEDICATION: ?Allergies as of 02/26/2022   ?No Known Allergies ?  ? ?  ?Medication List  ?  ? ?STOP taking these medications   ? ?aspirin EC 81 MG tablet ?  ?losartan 50 MG tablet ?Commonly known as: COZAAR ?  ? ?  ? ?TAKE these medications   ? ?amphetamine-dextroamphetamine 30 MG 24 hr capsule ?Commonly known as: ADDERALL XR ?Take 30 mg by mouth daily. ?  ?ARIPiprazole 15 MG tablet ?Commonly known as: ABILIFY ?Take  15 mg by mouth daily. ?  ?desvenlafaxine 50 MG 24 hr tablet ?Commonly known as: PRISTIQ ?Take 50 mg by mouth daily. ?  ?Esketamine HCl (84 MG Dose) 28 MG/DEVICE Sopk ?Place 1 spray into both nostrils as directed. ?  ?ezetimibe 10 MG tablet ?Commonly known as: ZETIA ?Take 10 mg by mouth daily. ?  ?furosemide 20 MG tablet ?Commonly  known as: LASIX ?Take 20 mg by mouth as needed for fluid or edema. ?  ?magnesium oxide 400 (240 Mg) MG tablet ?Commonly known as: MAG-OX ?Take 1 tablet (400 mg total) by mouth daily. ?Start taking on: February 27, 2022 ?  ?metoprolol succinate 25 MG 24 hr tablet ?Commonly known as: TOPROL-XL ?Take 50 mg by mouth daily. ?  ?RABEprazole 20 MG tablet ?Commonly known as: Aciphex ?Take 1 tablet (20 mg total) by mouth 2 (two) times daily before a meal. ?  ?Rivaroxaban Stater Pack (15 mg and 20 mg) ?Commonly known as: XARELTO STARTER PACK ?Follow package directions: Take one 15mg  tablet by mouth twice a day. On day 22, switch to one 20mg  tablet once a day. Take with food. ?  ?Semaglutide(0.25 or 0.5MG /DOS) 2 MG/1.5ML Sopn ?Inject 0.5 mg into the skin every Sunday. ?  ?temazepam 15 MG capsule ?Commonly known as: RESTORIL ?Take 15 mg by mouth at bedtime as needed for sleep. ?  ?testosterone cypionate 200 MG/ML injection ?Commonly known as: DEPOTESTOSTERONE CYPIONATE ?Inject 100 mg into the muscle every Sunday. ?  ?thiamine 100 MG tablet ?Take 1 tablet (100 mg total) by mouth daily. ?Start taking on: February 27, 2022 ?  ? ?  ? ? Follow-up Information   ? ? Saturday, MD Follow up in 5 day(s).   ?Specialty: Family Medicine ?Contact information: ?[address] ?[address] ?[phone] ? ? ?  ?  ? ? Schnier, [id number], MD Follow up in 2 week(s).   ?Specialties: Vascular Surgery, Cardiology, Radiology, Vascular Surgery ?Contact information: ?[address] ?[place] [address] ?[phone] ? ? ?  ?  ? ?  ?  ? ?  ? ?Discharge Exam: ?Filed Weights  ? 02/24/22 1314  ?Weight: 106.6 kg  ? ?Physical Exam ?HENT:  ?   Head: Normocephalic.  ?   Mouth/Throat:  ?   Pharynx: No oropharyngeal exudate.  ?Eyes:  ?   General: Lids are normal.  ?   Conjunctiva/sclera: Conjunctivae normal.  ?Cardiovascular:  ?   Rate and Rhythm: Normal rate and regular rhythm.  ?   Heart sounds: Normal heart sounds, S1 normal and S2  normal.  ?Pulmonary:  ?   Breath sounds: Examination of the right-lower field reveals decreased breath sounds. Examination of the left-lower field reveals decreased breath sounds. Decreased breath sounds present. No wheezing, rhonchi or rales.  ?Abdominal:  ?   Palpations: Abdomen is soft.  ?   Tenderness: There is no abdominal tenderness.  ?Musculoskeletal:  ?   Right lower leg: No swelling.  ?   Left lower leg: No swelling.  ?Skin: ?   General: Skin is warm.  ?   Findings: No rash.  ?Neurological:  ?   Mental Status: He is alert and oriented to person, place, and time.  ?   Comments: No tremor  ?  ? ?Condition at discharge: stable ? ?The results of significant diagnostics from this hospitalization (including imaging, microbiology, ancillary and laboratory) are listed below for reference.  ? ?Imaging Studies: ?DG Chest 2 View ? ?Result Date: 02/24/2022 ?CLINICAL DATA:  Shortness of breath EXAM: CHEST - 2 VIEW COMPARISON:  10/16/2018 FINDINGS: The heart size and mediastinal contours are within normal limits. No focal airspace consolidation, pleural effusion, or pneumothorax. The visualized skeletal structures are unremarkable. IMPRESSION: No active cardiopulmonary disease. Electronically Signed   By: Duanne Guess D.O.   On: 02/24/2022 14:00  ? ?CT Angio Chest PE W and/or Wo Contrast ? ?Result Date: 02/24/2022 ?CLINICAL DATA:  Shortness of breath. EXAM: CT ANGIOGRAPHY CHEST WITH CONTRAST TECHNIQUE: Multidetector CT imaging of the chest was performed using the standard protocol during bolus administration of intravenous contrast. Multiplanar CT image reconstructions and MIPs were obtained to evaluate the vascular anatomy. RADIATION DOSE REDUCTION: This exam was performed according to the departmental dose-optimization program which includes automated exposure control, adjustment of the mA and/or kV according to patient size and/or use of iterative reconstruction technique. CONTRAST:  OMNIPAQUE IOHEXOL 350  MG/ML SOLN COMPARISON:  October 16, 2018. FINDINGS: Cardiovascular: Large saddle embolus is noted extending into both pulmonary arteries and respective lower lobe branches. No pericardial effusion is noted. RV/LV rat

## 2022-02-26 NOTE — Consult Note (Signed)
ANTICOAGULATION CONSULT NOTE - Initial Consult ? ?Pharmacy Consult for Xarelto ?Indication: pulmonary embolus ? ?No Known Allergies ? ?Patient Measurements: ?Height: 5\' 8"  (172.7 cm) ?Weight: 106.6 kg (235 lb) ?IBW/kg (Calculated) : 68.4 ? ? ?Vital Signs: ?Temp: 97.9 ?F (36.6 ?C) (04/29 0725) ?Temp Source: Oral (04/29 0310) ?BP: 118/89 (04/29 0725) ?Pulse Rate: 79 (04/29 0725) ? ?Labs: ?Recent Labs  ?  02/24/22 ?1325 02/24/22 ?1432 02/24/22 ?1505 02/25/22 ?0428 02/25/22 ?2026 02/26/22 ?[id number]  ?HGB 17.0  --   --  14.7  --  13.2  ?HCT 49.7  --   --  42.5  --  39.2  ?PLT 196  --   --  152  --  139*  ?APTT  --  30  --   --   --   --   ?LABPROT  --  15.0  --   --   --   --   ?INR  --  1.2  --   --   --   --   ?HEPARINUNFRC  --   --    < > 0.35 0.16* 0.41  ?CREATININE 1.19  --   --  1.27*  --  1.18  ?TROPONINIHS 76* 76*  --   --   --   --   ? < > = values in this interval not displayed.  ? ? ?Estimated Creatinine Clearance: 72.9 mL/min (by C-G formula based on SCr of 1.18 mg/dL). ? ? ?Medical History: ?Past Medical History:  ?Diagnosis Date  ? Atrial fibrillation ([place])   ? COPD (chronic obstructive pulmonary disease) ([place])   ? GERD (gastroesophageal reflux disease)   ? Hypertension   ? Prediabetes   ? Seizure ([place]) 2009  ? x 1, pt reports it was related to an antidepressant he was taking  ? ? ?Medications:  ?IV Heparin gtt ?ASA 81 mg daily  ? ?Assessment: ?66 y.o. male with a history of depression, GERD, PE, obesity who comes ED 02/24/22 complaining of shortness of breath. Found to have acute saddle PE and started on heparin infusion. Pharmacy consulted to transition to oral rivaroxaban therapy.  ? ?Goal of Therapy:  ?Monitor platelets by anticoagulation protocol: Yes ?  ?Plan:  ?Discontinue IV heparin infusion ?Initiate Rivaroxaban 15 mg BID x 21 days, followed by 20 mg daily with food thereafter ?CBC at least every 3 days while admitted  ? ?Dorothe Pea, PharmD, BCPS ?Clinical Pharmacist   ?02/26/2022,9:07 AM ? ? ?

## 2022-02-26 NOTE — Progress Notes (Addendum)
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 4x4 gauze and tegaderm dressing applied by RN. Dr. Renae Gloss aware. ?

## 2022-02-26 NOTE — TOC Transition Note (Signed)
Transition of Care (TOC) - CM/SW Discharge Note ? ? ?Patient Details  ?Name: Dwayne James ?MRN: [id number] ?Date of Birth: [birth year] ? ?Transition of Care (TOC) CM/SW Contact:  ?Bing Quarry, RN ?Phone Number: ?02/26/2022, 3:03 PM ? ? ?Clinical Narrative:  4/29: Patient being discharged today. Has Medicare [place] issue affording Xarelto. Coupon given and a pharmacy that has a starter pack was found by Unit RN. Patient informed he will need to make sure no gaps in Eliquis medication and start looking for resources to pay for medications or contact PCP. Gabriel Cirri RN CM   ? ? ? ?Final next level of care: Home/Self Care ?Barriers to Discharge: Barriers Resolved ? ? ?Patient Goals and CMS Choice ?  ?  ?  ? ?Discharge Placement ?  ?           ?  ?  ?  ?  ? ?Discharge Plan and Services ?  ?  ?           ?DME Arranged: N/A ?DME Agency: NA ?  ?  ?  ?HH Arranged: NA ?HH Agency: NA ?  ?  ?  ? ?Social Determinants of Health (SDOH) Interventions ?  ? ? ?Readmission Risk Interventions ?   ? View : No data to display.  ?  ?  ?  ? ? ? ? ? ?

## 2022-02-26 NOTE — Progress Notes (Addendum)
SATURATION QUALIFICATIONS ? ?Patient Saturations on Room Air at Rest = 97% ? ?Patient Saturations on Room Air while Ambulating = 92% ? ?Saturation corrected with use of telemetry O2 monitoring. ?

## 2022-02-28 ENCOUNTER — Encounter: Payer: Self-pay | Admitting: Vascular Surgery

## 2022-03-07 ENCOUNTER — Other Ambulatory Visit: Payer: Self-pay | Admitting: Gastroenterology

## 2022-03-14 ENCOUNTER — Ambulatory Visit (INDEPENDENT_AMBULATORY_CARE_PROVIDER_SITE_OTHER): Payer: Medicare Other | Admitting: Vascular Surgery

## 2022-03-14 ENCOUNTER — Encounter (INDEPENDENT_AMBULATORY_CARE_PROVIDER_SITE_OTHER): Payer: Self-pay | Admitting: Vascular Surgery

## 2022-03-14 VITALS — BP 132/83 | HR 69 | Resp 16 | Wt 234.4 lb

## 2022-03-14 DIAGNOSIS — I2602 Saddle embolus of pulmonary artery with acute cor pulmonale: Secondary | ICD-10-CM | POA: Diagnosis not present

## 2022-03-14 DIAGNOSIS — E119 Type 2 diabetes mellitus without complications: Secondary | ICD-10-CM

## 2022-03-14 DIAGNOSIS — I480 Paroxysmal atrial fibrillation: Secondary | ICD-10-CM | POA: Diagnosis not present

## 2022-03-14 DIAGNOSIS — K219 Gastro-esophageal reflux disease without esophagitis: Secondary | ICD-10-CM

## 2022-03-14 DIAGNOSIS — I10 Essential (primary) hypertension: Secondary | ICD-10-CM | POA: Diagnosis not present

## 2022-03-14 NOTE — Progress Notes (Signed)
MRN : [id number]  Dwayne James is a 66 y.o. (03-28-1956) male who presents with chief complaint of legs hurt and swell.  History of Present Illness:   The patient presents to the office for follow-up evaluation status post pulmonary thrombectomy.  Thrombectomy was performed at at [facility] on 02/25/2022.    Procedure: Mechanical thrombectomy bilateral lobar pulmonary arteries for removal of pulmonary emboli using the Penumbra CAT 8 thrombectomy catheter.  The presenting symptoms were pleuritic chest pain and profound SOB.  CT angiogram demonstrated a large volume of emboli with significant right heart strain.  The patient notes resolution of the shortness of breath symptoms.  At this time the patient denies pleuritic chest pain.  The patient denies persisting cough or hemoptysis.  The patient has not been using compression therapy at this point.  The patient denies significant leg pain and swelling dependency.  The patient notes minimal edema in the morning.    No blood per rectum or blood in any sputum.  No excessive bruising per the patient.   No recent shortening of the patient's walking distance or new symptoms consistent with claudication.  No history of rest pain symptoms. No new ulcers or wounds of the lower extremities have occurred.  The patient denies amaurosis fugax or recent TIA symptoms. There are no recent neurological changes noted. No recent episodes of angina or shortness of breath documented.    Current Meds  Medication Sig   amphetamine-dextroamphetamine (ADDERALL XR) 30 MG 24 hr capsule Take 30 mg by mouth daily.   ARIPiprazole (ABILIFY) 15 MG tablet Take 15 mg by mouth daily.   desvenlafaxine (PRISTIQ) 50 MG 24 hr tablet Take 50 mg by mouth daily.   Esketamine HCl, 84 MG Dose, 28 MG/DEVICE SOPK Place 1 spray into both nostrils as directed.   ezetimibe (ZETIA) 10 MG tablet Take 10 mg by mouth daily.   furosemide (LASIX) 20 MG tablet Take 20 mg by mouth  as needed for fluid or edema.   losartan (COZAAR) 50 MG tablet Take 50 mg by mouth 2 (two) times daily.   magnesium oxide (MAG-OX) 400 (240 Mg) MG tablet Take 1 tablet (400 mg total) by mouth daily.   metoprolol succinate (TOPROL-XL) 25 MG 24 hr tablet Take 50 mg by mouth daily.   RABEprazole (ACIPHEX) 20 MG tablet Take 1 tablet (20 mg total) by mouth 2 (two) times daily before a meal.   RIVAROXABAN (XARELTO) VTE STARTER PACK (15 & 20 MG) Follow package directions: Take one 15mg  tablet by mouth twice a day. On day 22, switch to one 20mg  tablet once a day. Take with food.   Semaglutide,0.25 or 0.5MG /DOS, 2 MG/1.5ML SOPN Inject 0.5 mg into the skin every Sunday.   temazepam (RESTORIL) 15 MG capsule Take 15 mg by mouth at bedtime as needed for sleep.   testosterone cypionate (DEPOTESTOSTERONE CYPIONATE) 200 MG/ML injection Inject 100 mg into the muscle every Sunday.   thiamine 100 MG tablet Take 1 tablet (100 mg total) by mouth daily.    Past Medical History:  Diagnosis Date   Atrial fibrillation (HCC)    COPD (chronic obstructive pulmonary disease) (HCC)    GERD (gastroesophageal reflux disease)    Hypertension    Prediabetes    Seizure (HCC) 2009   x 1, pt reports it was related to an antidepressant he was taking    Past Surgical History:  Procedure Laterality Date   ANKLE ARTHROSCOPY Left 2001   ATRIAL FIBRILLATION  ABLATION  04/2020   CARDIOVERSION     x 2   COLONOSCOPY WITH PROPOFOL N/A 06/17/2021   Procedure: COLONOSCOPY WITH PROPOFOL;  Surgeon: Toney Reil, MD;  Location: [facility];  Service: Endoscopy;  Laterality: N/A;   ESOPHAGOGASTRODUODENOSCOPY (EGD) WITH PROPOFOL N/A 08/11/2021   Procedure: ESOPHAGOGASTRODUODENOSCOPY (EGD) WITH PROPOFOL;  Surgeon: Toney Reil, MD;  Location: [facility];  Service: Gastroenterology;  Laterality: N/A;   KNEE ARTHROSCOPY Right 2009   NISSEN FUNDOPLICATION  2006   PULMONARY THROMBECTOMY N/A 10/16/2018   Procedure:  PULMONARY THROMBECTOMY;  Surgeon: Renford Dills, MD;  Location: [facility];  Service: Cardiovascular;  Laterality: N/A;   PULMONARY THROMBECTOMY Bilateral 02/25/2022   Procedure: PULMONARY THROMBECTOMY;  Surgeon: Renford Dills, MD;  Location: [facility];  Service: Cardiovascular;  Laterality: Bilateral;    Social History Social History   Tobacco Use   Smoking status: Former    Types: Cigarettes    Quit date: 05/28/2004    Years since quitting: 17.8    Passive exposure: Past   Smokeless tobacco: Current    Types: Chew  Vaping Use   Vaping Use: Never used  Substance Use Topics   Alcohol use: Yes    Comment: 2-3 drinks 4 days a week (liquor, beer, & wine)   Drug use: No    Family History Family History  Problem Relation Age of Onset   Heart disease Father    COPD Father    Emphysema Father    Stroke Father     No Known Allergies   REVIEW OF SYSTEMS (Negative unless checked)  Constitutional: [] Weight loss  [] Fever  [] Chills Cardiac: [] Chest pain   [] Chest pressure   [] Palpitations   [] Shortness of breath when laying flat   [] Shortness of breath with exertion. Vascular:  [] Pain in legs with walking   [x] Pain in legs at rest  [] History of DVT   [] Phlebitis   [x] Swelling in legs   [] Varicose veins   [] Non-healing ulcers Pulmonary:   [] Uses home oxygen   [] Productive cough   [] Hemoptysis   [] Wheeze  [] COPD   [] Asthma Neurologic:  [] Dizziness   [] Seizures   [] History of stroke   [] History of TIA  [] Aphasia   [] Vissual changes   [] Weakness or numbness in arm   [] Weakness or numbness in leg Musculoskeletal:   [] Joint swelling   [] Joint pain   [] Low back pain Hematologic:  [] Easy bruising  [] Easy bleeding   [x] Hypercoagulable state   [] Anemic Gastrointestinal:  [] Diarrhea   [] Vomiting  [x] Gastroesophageal reflux/heartburn   [] Difficulty swallowing. Genitourinary:  [] Chronic kidney disease   [] Difficult urination  [] Frequent urination   [] Blood in  urine Skin:  [] Rashes   [] Ulcers  Psychological:  [] History of anxiety   []  History of major depression.  Physical Examination  Vitals:   03/14/22 1524  BP: 132/83  Pulse: 69  Resp: 16  Weight: 234 lb 6.4 oz (106.3 kg)   Body mass index is 35.64 kg/m. Gen: WD/WN, NAD Head: /AT, No temporalis wasting.  Ear/Nose/Throat: Hearing grossly intact, nares w/o erythema or drainage, pinna without lesions Eyes: PER, EOMI, sclera nonicteric.  Neck: Supple, no gross masses.  No JVD.  Pulmonary:  Good air movement, no audible wheezing, no use of accessory muscles.  Cardiac: RRR, precordium not hyperdynamic. Vascular:  scattered varicosities present bilaterally.  Moderate venous stasis changes to the legs bilaterally.  2+ soft pitting edema  Vessel Right Left  Radial Palpable Palpable  Gastrointestinal:  soft, non-distended. No guarding/no peritoneal signs.  Musculoskeletal: M/S 5/5 throughout.  No deformity.  Neurologic: CN 2-12 intact. Pain and light touch intact in extremities.  Symmetrical.  Speech is fluent. Motor exam as listed above. Psychiatric: Judgment intact, Mood & affect appropriate for pt's clinical situation. Dermatologic: Venous rashes no ulcers noted.  No changes consistent with cellulitis. Lymph : No lichenification or skin changes of chronic lymphedema.  CBC Lab Results  Component Value Date   WBC 9.5 02/26/2022   HGB 13.2 02/26/2022   HCT 39.2 02/26/2022   MCV 103.7 (H) 02/26/2022   PLT 139 (L) 02/26/2022    BMET    Component Value Date/Time   NA 136 02/26/2022 0508   K 3.3 (L) 02/26/2022 0508   CL 103 02/26/2022 0508   CO2 26 02/26/2022 0508   GLUCOSE 122 (H) 02/26/2022 0508   BUN 16 02/26/2022 0508   CREATININE 1.18 02/26/2022 0508   CALCIUM 8.5 (L) 02/26/2022 0508   GFRNONAA >60 02/26/2022 0508   GFRAA >60 10/16/2018 1610   Estimated Creatinine Clearance: 72.8 mL/min (by C-G formula based on SCr of 1.18 mg/dL).  COAG Lab Results  Component Value  Date   INR 1.2 02/24/2022   INR 1.07 10/16/2018    Radiology DG Chest 2 View  Result Date: 02/24/2022 CLINICAL DATA:  Shortness of breath EXAM: CHEST - 2 VIEW COMPARISON:  10/16/2018 FINDINGS: The heart size and mediastinal contours are within normal limits. No focal airspace consolidation, pleural effusion, or pneumothorax. The visualized skeletal structures are unremarkable. IMPRESSION: No active cardiopulmonary disease. Electronically Signed   By: Duanne Guess D.O.   On: 02/24/2022 14:00   CT Angio Chest PE W and/or Wo Contrast  Result Date: 02/24/2022 CLINICAL DATA:  Shortness of breath. EXAM: CT ANGIOGRAPHY CHEST WITH CONTRAST TECHNIQUE: Multidetector CT imaging of the chest was performed using the standard protocol during bolus administration of intravenous contrast. Multiplanar CT image reconstructions and MIPs were obtained to evaluate the vascular anatomy. RADIATION DOSE REDUCTION: This exam was performed according to the departmental dose-optimization program which includes automated exposure control, adjustment of the mA and/or kV according to patient size and/or use of iterative reconstruction technique. CONTRAST:  OMNIPAQUE IOHEXOL 350 MG/ML SOLN COMPARISON:  October 16, 2018. FINDINGS: Cardiovascular: Large saddle embolus is noted extending into both pulmonary arteries and respective lower lobe branches. No pericardial effusion is noted. RV/LV ratio of 2.3 is noted suggesting right heart strain. Atherosclerosis thoracic aorta is noted without aneurysm formation. Mediastinum/Nodes: No enlarged mediastinal, hilar, or axillary lymph nodes. Thyroid gland, trachea, and esophagus demonstrate no significant findings. Lungs/Pleura: Lungs are clear. No pleural effusion or pneumothorax. Upper Abdomen: No acute abnormality. Musculoskeletal: No chest wall abnormality. No acute or significant osseous findings. Review of the MIP images confirms the above findings. IMPRESSION: Large saddle type  pulmonary embolus is noted which extends into both pulmonary arteries and respective lower lobe branches. Positive for acute PE with CT evidence of right heart strain (RV/LV Ratio = 2.3) consistent with at least submassive (intermediate risk) PE. The presence of right heart strain has been associated with an increased risk of morbidity and mortality. Please refer to the "PE Focused" order set in EPIC. Critical Value/emergent results were called by telephone at the time of interpretation on 02/24/2022 at 3:56 pm to provider Dr. Alfred Levins, who verbally acknowledged these results. Aortic Atherosclerosis (ICD10-I70.0). Electronically Signed   By: Lupita Raider M.D.   On: 02/24/2022 15:57   PERIPHERAL VASCULAR CATHETERIZATION  Result Date: 02/25/2022 See surgical note for result.  US Venous Img Lower Bilateral (DVT)  Result Date: 02/28/2022 CLINICAL DATA:  Pulmonary embolus. EXAM: BILATERAL LOWER EXTREMITY VENOUS DOPPLER ULTRASOUND TECHNIQUE: Gray-scale sonography with graded compression, as well as color Doppler and duplex ultrasound were performed to evaluate the lower extremity deep venous systems from the level of the common femoral vein and including the common femoral, femoral, profunda femoral, popliteal and calf veins including the posterior tibial, peroneal and gastrocnemius veins when visible. The superficial great saphenous vein was also interrogated. Spectral Doppler was utilized to evaluate flow at rest and with distal augmentation maneuvers in the common femoral, femoral and popliteal veins. COMPARISON:  None. FINDINGS: RIGHT LOWER EXTREMITY VENOUS Normal compressibility of the RIGHT common femoral, superficial femoral, and popliteal veins, as well as the visualized calf veins. Visualized portions of profunda femoral vein and great saphenous vein unremarkable. No filling defects to suggest DVT on grayscale or color Doppler imaging. Doppler waveforms show normal direction of venous flow, normal  respiratory plasticity and response to augmentation. OTHER No evidence of superficial thrombophlebitis. A well-circumscribed, curvilinear anechoic collection is demonstrated at the RIGHT popliteal fossa, measuring approximately 5.3 x 2.0 x 1.6 cm. Limitations: none LEFT LOWER EXTREMITY VENOUS Heterogeneously echogenic filling defect with lack of coaptation of the proximal portions of the LEFT (superficial) femoral vein. Normal compressibility of the LEFT common femoral and popliteal veins, as well as the visualized calf veins. Visualized portions of profunda femoral vein and great saphenous vein unremarkable. OTHER No evidence of superficial thrombophlebitis or abnormal fluid collection. Limitations: none IMPRESSION: 1. Examination is POSITIVE for femoropopliteal DVT within the LEFT femoral vein. 2. No evidence of femoropopliteal DVT within the RIGHT lower extremity. 3. 5 cm RIGHT popliteal fossa/Baker cyst. Roanna Banning, MD [facility] Radiology Electronically Signed   By: Roanna Banning M.D.   On: 02/28/2022 00:49   ECHOCARDIOGRAM COMPLETE  Result Date: 02/26/2022    ECHOCARDIOGRAM REPORT   Patient Name:   Dwayne James [facility] Date of Exam: 02/25/2022 Medical Rec #:  [id number]             Height:       68.0 in Accession #:    [id number]            Weight:       235.0 lb Date of Birth:  1956-05-29              BSA:          2.189 m Patient Age:    65 years              BP:           112/69 mmHg Patient Gender: M                     HR:           95 bpm. Exam Location:  [facility] Procedure: 2D Echo, Cardiac Doppler and Color Doppler Indications:     I26.09 Pulmonary Embolus  History:         Patient has prior history of Echocardiogram examinations, most                  recent 10/16/2018. COPD, Arrythmias:Atrial Fibrillation; Risk                  Factors:Hypertension and Pre-diabetes.  Sonographer:     Daphine Deutscher RDCS Referring Phys:  [phone] Latina Craver Damont Balles  Diagnosing Phys: Debbe OdeaBrian Agbor-Etang MD IMPRESSIONS  1. Left ventricular ejection fraction, by estimation, is 60 to 65%. The left ventricle has normal function. The left ventricle has no regional wall motion abnormalities. Left ventricular diastolic parameters are consistent with Grade I diastolic dysfunction (impaired relaxation).  2. Right ventricular systolic function is moderately reduced. The right ventricular size is severely enlarged. There is normal pulmonary artery systolic pressure. The estimated right ventricular systolic pressure is 34.6 mmHg.  3. Right atrial size was moderately dilated.  4. The mitral valve is normal in structure. Mild mitral valve regurgitation.  5. The aortic valve is tricuspid. Aortic valve regurgitation is not visualized. Aortic valve sclerosis is present, with no evidence of aortic valve stenosis.  6. The inferior vena cava is normal in size with greater than 50% respiratory variability, suggesting right atrial pressure of 3 mmHg. FINDINGS  Left Ventricle: Left ventricular ejection fraction, by estimation, is 60 to 65%. The left ventricle has normal function. The left ventricle has no regional wall motion abnormalities. The left ventricular internal cavity size was normal in size. There is  no left ventricular hypertrophy. Left ventricular diastolic parameters are consistent with Grade I diastolic dysfunction (impaired relaxation). Right Ventricle: The right ventricular size is severely enlarged. No increase in right ventricular wall thickness. Right ventricular systolic function is moderately reduced. There is normal pulmonary artery systolic pressure. The tricuspid regurgitant velocity is 2.81 m/s, and with an assumed right atrial pressure of 3 mmHg, the estimated right ventricular systolic pressure is 34.6 mmHg. Left Atrium: Left atrial size was normal in size. Right Atrium: Right atrial size was moderately dilated. Pericardium: There is no evidence of pericardial effusion.  Mitral Valve: The mitral valve is normal in structure. Mild mitral valve regurgitation. Tricuspid Valve: The tricuspid valve is normal in structure. Tricuspid valve regurgitation is mild. Aortic Valve: The aortic valve is tricuspid. Aortic valve regurgitation is not visualized. Aortic valve sclerosis is present, with no evidence of aortic valve stenosis. Pulmonic Valve: The pulmonic valve was not well visualized. Pulmonic valve regurgitation is not visualized. Aorta: The aortic root is normal in size and structure. Venous: The inferior vena cava is normal in size with greater than 50% respiratory variability, suggesting right atrial pressure of 3 mmHg. IAS/Shunts: No atrial level shunt detected by color flow Doppler.  LEFT VENTRICLE PLAX 2D LVIDd:         4.70 cm   Diastology LVIDs:         3.10 cm   LV e' medial:    7.29 cm/s LV PW:         0.70 cm   LV E/e' medial:  5.9 LV IVS:        0.70 cm   LV e' lateral:   13.80 cm/s LVOT diam:     2.00 cm   LV E/e' lateral: 3.1 LV SV:         51 LV SV Index:   23 LVOT Area:     3.14 cm  RIGHT VENTRICLE             IVC RV Basal diam:  7.10 cm     IVC diam: 1.60 cm RV Mid diam:    5.90 cm RV S prime:     12.10 cm/s TAPSE (M-mode): 1.8 cm LEFT ATRIUM             Index        RIGHT ATRIUM  Index LA diam:        4.20 cm 1.92 cm/m   RA Area:     27.40 cm LA Vol (A2C):   29.1 ml 13.30 ml/m  RA Volume:   103.00 ml 47.06 ml/m LA Vol (A4C):   39.6 ml 18.09 ml/m LA Biplane Vol: 35.4 ml 16.17 ml/m  AORTIC VALVE LVOT Vmax:   101.00 cm/s LVOT Vmean:  79.600 cm/s LVOT VTI:    0.162 m  AORTA Ao Root diam: 3.20 cm MITRAL VALVE               TRICUSPID VALVE MV Area (PHT): 3.99 cm    TR Peak grad:   31.6 mmHg MV Decel Time: 190 msec    TR Vmax:        281.00 cm/s MV E velocity: 42.80 cm/s MV A velocity: 65.10 cm/s  SHUNTS MV E/A ratio:  0.66        Systemic VTI:  0.16 m                            Systemic Diam: 2.00 cm Debbe Odea MD Electronically signed by Debbe Odea MD Signature Date/Time: 02/26/2022/8:56:17 AM    Final    US Abdomen Limited RUQ (LIVER/GB)  Result Date: 02/18/2022 CLINICAL DATA:  Abdominal pain. EXAM: ULTRASOUND ABDOMEN LIMITED RIGHT UPPER QUADRANT COMPARISON:  CT abdomen pelvis 01/12/2022. FINDINGS: Gallbladder: Contains sludge in stones measuring up to 9 mm. No wall thickening. Negative sonographic Murphy sign. No pericholecystic fluid. Common bile duct: Diameter: 4 mm, within normal limits. No intrahepatic biliary ductal dilatation. Liver: Diffusely increased in echogenicity. No focal lesion. Portal vein is patent on color Doppler imaging with normal direction of blood flow towards the liver. Other: None. IMPRESSION: 1. Gallbladder sludge and stones.  No acute findings. 2. Hepatic steatosis. Electronically Signed   By: Leanna Battles M.D.   On: 02/18/2022 14:16     Assessment/Plan 1. Acute saddle pulmonary embolism with acute cor pulmonale (HCC) Recommend:   No surgery or intervention at this point in time.  IVC filter is not indicated at present.  The patient is on anticoagulation   Elevation was stressed, such as the use of a recliner.  I have discussed  DVT and post phlebitic changes such as swelling and why it  causes symptoms such as pain.  The patient should wear graduated compression stockings beginning after three full days of anticoagulation.  The compression should be worn on a daily basis. The patient should wearing the stockings first thing in the morning and removing them in the evening. The patient should not to sleep in the stockings.  In addition, behavioral modification including elevation during the day and avoidance of prolonged dependency will be initiated.    The patient will continue anticoagulation life long as this is a recurrence and there have not been any problems or complications at this point.    He will follow up with me as needed.  2. Paroxysmal atrial fibrillation (HCC) Continue  antiarrhythmia medications as already ordered, these medications have been reviewed and there are no changes at this time.  Continue anticoagulation as ordered by Cardiology Service   3. Hypertension, unspecified type Continue antihypertensive medications as already ordered, these medications have been reviewed and there are no changes at this time.   4. Gastroesophageal reflux disease, unspecified whether esophagitis present Continue PPI as already ordered, this medication has been reviewed and there are no  changes at this time.  Avoidence of caffeine and alcohol  Moderate elevation of the head of the bed    5. Type 2 diabetes mellitus without complication, without long-term current use of insulin (HCC) Continue hypoglycemic medications as already ordered, these medications have been reviewed and there are no changes at this time.  Hgb A1C to be monitored as already arranged by primary service     [name], MD  03/14/2022 3:35 PM

## 2022-04-04 ENCOUNTER — Ambulatory Visit: Payer: Medicare Other | Admitting: Surgery

## 2022-04-13 ENCOUNTER — Telehealth: Payer: Self-pay

## 2022-04-13 ENCOUNTER — Encounter: Payer: Self-pay | Admitting: Surgery

## 2022-04-13 ENCOUNTER — Ambulatory Visit (INDEPENDENT_AMBULATORY_CARE_PROVIDER_SITE_OTHER): Payer: Medicare Other | Admitting: Surgery

## 2022-04-13 ENCOUNTER — Other Ambulatory Visit: Payer: Self-pay

## 2022-04-13 VITALS — BP 150/92 | HR 93 | Temp 98.3°F | Ht 68.0 in | Wt 227.4 lb

## 2022-04-13 DIAGNOSIS — K2100 Gastro-esophageal reflux disease with esophagitis, without bleeding: Secondary | ICD-10-CM | POA: Diagnosis not present

## 2022-04-13 DIAGNOSIS — K8020 Calculus of gallbladder without cholecystitis without obstruction: Secondary | ICD-10-CM

## 2022-04-13 NOTE — Patient Instructions (Signed)
Our surgery scheduler will call you within 24-48 hours to schedule your surgery. Please have the Blue surgery sheet available when speaking with her.   Hiatal Hernia  A hiatal hernia occurs when part of the stomach slides above the muscle that separates the abdomen from the chest (diaphragm). A person can be born with a hiatal hernia (congenital), or it may develop over time. In almost all cases of hiatal hernia, only the top part of the stomach pushes through the diaphragm. Many people have a hiatal hernia with no symptoms. The larger the hernia, the more likely it is that you will have symptoms. In some cases, a hiatal hernia allows stomach acid to flow back into the tube that carries food from your mouth to your stomach (esophagus). This may cause heartburn symptoms. Severe heartburn symptoms may mean that you have developed a condition called gastroesophageal reflux disease (GERD). What are the causes? This condition is caused by a weakness in the opening (hiatus) where the esophagus passes through the diaphragm to attach to the upper part of the stomach. A person may be born with a weakness in the hiatus, or a weakness can develop over time. What increases the risk? This condition is more likely to develop in: Older people. Age is a major risk factor for a hiatal hernia, especially if you are over the age of 37. Pregnant women. People who are overweight. People who have frequent constipation. What are the signs or symptoms? Symptoms of this condition usually develop in the form of GERD symptoms. Symptoms include: Heartburn. Belching. Indigestion. Trouble swallowing. Coughing or wheezing. Sore throat. Hoarseness. Chest pain. Nausea and vomiting. How is this diagnosed? This condition may be diagnosed during testing for GERD. Tests that may be done include: X-rays of your stomach or chest. An upper gastrointestinal (GI) series. This is an X-ray exam of your GI tract that is taken after  you swallow a chalky liquid that shows up clearly on the X-ray. Endoscopy. This is a procedure to look into your stomach using a thin, flexible tube that has a tiny camera and light on the end of it. How is this treated? This condition may be treated by: Dietary and lifestyle changes to help reduce GERD symptoms. Medicines. These may include: Over-the-counter antacids. Medicines that make your stomach empty more quickly. Medicines that block the production of stomach acid (H2 blockers). Stronger medicines to reduce stomach acid (proton pump inhibitors). Surgery to repair the hernia, if other treatments are not helping. If you have no symptoms, you may not need treatment. Follow these instructions at home: Lifestyle and activity Do not use any products that contain nicotine or tobacco, such as cigarettes and e-cigarettes. If you need help quitting, ask your health care provider. Try to achieve and maintain a healthy body weight. Avoid putting pressure on your abdomen. Anything that puts pressure on your abdomen increases the amount of acid that may be pushed up into your esophagus. Avoid bending over, especially after eating. Raise the head of your bed by putting blocks under the legs. This keeps your head and esophagus higher than your stomach. Do not wear tight clothing around your chest or stomach. Try not to strain when having a bowel movement, when urinating, or when lifting heavy objects. Eating and drinking Avoid foods that can worsen GERD symptoms. These may include: Fatty foods, like fried foods. Citrus fruits, like oranges or lemon. Other foods and drinks that contain acid, like orange juice or tomatoes. Spicy food. Chocolate. Eat  frequent small meals instead of three large meals a day. This helps prevent your stomach from getting too full. Eat slowly. Do not lie down right after eating. Do not eat 1-2 hours before bed. Do not drink beverages with caffeine. These include  cola, coffee, cocoa, and tea. Do not drink alcohol. General instructions Take over-the-counter and prescription medicines only as told by your health care provider. Keep all follow-up visits as told by your health care provider. This is important. Contact a health care provider if: Your symptoms are not controlled with medicines or lifestyle changes. You are having trouble swallowing. You have coughing or wheezing that will not go away. Get help right away if: Your pain is getting worse. Your pain spreads to your arms, neck, jaw, teeth, or back. You have shortness of breath. You sweat for no reason. You feel sick to your stomach (nauseous) or you vomit. You vomit blood. You have bright red blood in your stools. You have black, tarry stools. Summary A hiatal hernia occurs when part of the stomach slides above the muscle that separates the abdomen from the chest (diaphragm). A person may be born with a weakness in the hiatus, or a weakness can develop over time. Symptoms of hiatal hernia may include heartburn, trouble swallowing, or sore throat. Management of hiatal hernia includes eating frequent small meals instead of three large meals a day. Get help right away if you vomit blood, have bright red blood in your stools, or have black, tarry stools. This information is not intended to replace advice given to you by your health care provider. Make sure you discuss any questions you have with your health care provider. Document Revised: 08/31/2021 Document Reviewed: 09/17/2020 Elsevier Patient Education  [place] After Nissen Fundoplication After a Nissen fundoplication procedure, it is common to have some difficulty swallowing. The part of your body that moves food and liquid from your mouth to your stomach (esophagus) will be swollen and may feel tight. It will take several weeks or months for your esophagus and stomach to heal. By following a special eating plan,  you can prevent problems such as pain, swelling or pressure in the abdomen (bloating), gas, nausea, or diarrhea. What are tips for following this plan? Cooking Cook all foods until they are soft. Remove skins and seeds from fruits and vegetables before eating. Remove skin and gristle from meats. Grind or finely mince meats before eating. Avoid over-cooking meat. Dry, tough meat is more difficult to swallow. Avoid using oil when cooking, or use only a small amount of oil. Avoid using seasoning when cooking, or use only a small amount of seasoning. Toast bread before eating. This makes it easier to swallow. Meal planning  Eat 6-8 small meals throughout the day. Right after the surgery, have a few meals that are only clear liquids. Clear liquids include: Water. Clear fruit juice, no pulp. Chicken, beef, or vegetable broth. Gelatin. Decaffeinated tea or coffee without milk. Popsicles or shaved ice. Depending on your progress, you may move to a full liquid diet as told by your health care provider. This includes clear liquids and the following: Dairy and alternative milks, such as soy milk. Strained creamed soups. Ice cream or sherbet. Pudding. Nutritional supplement drinks. Yogurt. A few days after surgery, you may be able to start eating a diet of soft foods. You may need to eat according to this plan for several weeks. Do not eat sweets or sweetened drinks at the beginning of  a meal. Doing that may cause your stomach to empty faster than it should (dumping syndrome). Lifestyle Always sit upright when eating or drinking. Eat slowly. Take small bites and chew food well before swallowing. Do not lie down after eating. Stay sitting up for 30 minutes or longer after each meal. Sip fluids between meals. Limit how much you drink at one time. With meals and snacks, have 4-8 oz (120-240 mL). This is equal to  cup-1 cup. Do not mix solid foods and liquids in the same mouthful. Drink enough  fluid to keep your urine pale yellow. Do not chew gum or drink fluids through a straw. Doing those things may cause you to swallow extra air. General information Do not drink carbonated drinks or alcohol. Avoid foods and drinks that contain caffeine and chocolate. Avoid foods and drinks that contain citrus or tomato. Allow hot soups and drinks to cool before eating. Avoid foods that cause gas, such as beans, peas, broccoli, or cabbage. If dairy milk products cause diarrhea, avoid them or eat them in small amounts. Recommended foods Fruits Any soft-cooked fruits after skins and seeds are removed. Fruit juice. Vegetables Any soft-cooked vegetables after skins and seeds are removed. Vegetable juice. Grains Cooked cereals. Dry cereals softened with liquid. Cooked pasta, rice, or other grains. Toasted bread. Bland crackers, such as soda or graham crackers. Meats and other protein foods Tender cuts of meat, poultry, or fish after bones, skin, and gristle are removed. Poached, boiled, or scrambled eggs. Canned fish. Tofu. Creamy nut butters. Dairy Milk. Yogurt. Cottage cheese. Mild cheeses. Beverages Nutritional supplement drinks. Decaffeinated tea or coffee. Sports drinks. Fats and oils Butter. Margarine. Mayonnaise. Vegetable oil. Smooth salad dressing. Sweets and desserts Plain hard candy. Marshmallows. Pudding. Ice cream. Gelatin. Sherbet. Seasoning and other foods Salt. Light seasonings. Mustard. Vinegar. The items listed above may not be a complete list of recommended foods and beverages. Contact a dietitian for more information. Foods to avoid Fruits Oranges. Grapefruit. Lemons. Limes. Citrus juices. Dried fruit. Crunchy, raw fruits. Vegetables Tomato sauce. Tomato juice. Broccoli. Cauliflower. Cabbage. Brussels sprouts. Crunchy, raw vegetables. Grains High-fiber or bran cereal. Cereal with nuts, dried fruit, or coconut. Sweet breads, rolls, coffee cake, or donuts. Chewy or crusty  breads. Popcorn. Meats and other protein foods Beans, peas, and lentils. Tough or fatty meats. Fried meats, chicken, or fish. Fried eggs. Nuts and seeds. Crunchy nut butters. Dairy Chocolate milk. Yogurt with chunks of fruit, nuts, seeds, or coconut. Strong cheeses. Beverages Carbonated soft drinks. Alcohol. Cocoa. Hot drinks. Fats and oils Bacon fat. Lard. Sweets and desserts Chocolate. Candy with nuts, coconut, or seeds. Peppermint. Cookies. Cakes. Pie crust. Seasoning and other foods Heavy seasonings. Chili sauce. Ketchup. Barbecue sauce. Angie Fava. Horseradish. The items listed above may not be a complete list of foods and beverages to avoid. Contact a dietitian for more information. Summary Following this eating plan after a Nissen fundoplication is an important part of healing after surgery. After surgery, you will start with a clear liquid diet before you progress to full liquids and soft foods. You may need to eat soft foods for several weeks. Avoid eating foods that cause irritation, gas, nausea, diarrhea, or swelling or pressure in the abdomen (bloating), and avoid foods that are difficult to swallow. Talk with a dietitian about which dietary choices are best for you. This information is not intended to replace advice given to you by your health care provider. Make sure you discuss any questions you have with your health  care provider. Document Revised: 05/03/2020 Document Reviewed: 05/03/2020 Elsevier Patient Education  [place].

## 2022-04-13 NOTE — Telephone Encounter (Signed)
Medical Clearance faxed to Dr.Osa Heide Spark [phone],

## 2022-04-15 ENCOUNTER — Encounter: Payer: Self-pay | Admitting: Surgery

## 2022-04-15 NOTE — Progress Notes (Signed)
Outpatient Surgical Follow Up  04/15/2022  Dwayne James is an 66 y.o. male.   Chief Complaint  Patient presents with   Follow-up    Discuss Nissen     HPI: 66 yo  male  rfollowing for GERD and ? Recurrent hiatal hernia. Patient reports that for more than a year, he has been experiencing intermittent episodes of severe epigastric pain burning in nature associated with nausea.   He does have reflux with heartburn.  He reports having 2 episodes of severe epigastric pain since last visit.  He also reports nocturnal regurgitation , He wishes to stop PPI meds and is interested pursuing antireflux surgery. He has history of Nissen's fundoplication in 2006 by Dr. Renda Rolls, no report is available. He continues to take Aciphex 20 mg p.o. twice daily.He   Please note that I have personally reviewed.  Since on the CT scan there is suspicious for slipped Nissen. He also recently underwent an upper endoscopy by Dr. Allegra Lai that I have personally reviewed the wrap seems to be partial and it is not clear-cut endoscopically wether or not this is a true slip fundoplication or not. 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  He also had a history of paroxysmal A-fib and currently is on eliquis and aspirin. He underwent a CT of the abdomen and pelvis as well as a barium swallow that I personally reviewed.  CT show actually an intact wrap to an barium swallow showed no evidence of paraesophageal hernia.  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  Past Medical History:  Diagnosis Date   Atrial fibrillation (HCC)    COPD (chronic obstructive pulmonary disease) (HCC)    GERD (gastroesophageal reflux disease)    Hypertension    Prediabetes    Seizure (HCC) 2009   x 1, pt reports it was related to an antidepressant he was taking    Past  Surgical History:  Procedure Laterality Date   ANKLE ARTHROSCOPY Left 2001   ATRIAL FIBRILLATION ABLATION  04/2020   CARDIOVERSION     x 2   COLONOSCOPY WITH PROPOFOL N/A 06/17/2021   Procedure: COLONOSCOPY WITH PROPOFOL;  Surgeon: Toney Reil, MD;  Location: [facility];  Service: Endoscopy;  Laterality: N/A;   ESOPHAGOGASTRODUODENOSCOPY (EGD) WITH PROPOFOL N/A 08/11/2021   Procedure: ESOPHAGOGASTRODUODENOSCOPY (EGD) WITH PROPOFOL;  Surgeon: Toney Reil, MD;  Location[facility];  Service: Gastroenterology;  Laterality: N/A;   KNEE ARTHROSCOPY Right 2009   NISSEN FUNDOPLICATION  2006   PULMONARY THROMBECTOMY N/A 10/16/2018   Procedure: PULMONARY THROMBECTOMY;  Surgeon: Renford Dills, MD;  Location: [facility];  Service: Cardiovascular;  Laterality: N/A;   PULMONARY THROMBECTOMY Bilateral 02/25/2022   Procedure: PULMONARY THROMBECTOMY;  Surgeon: Renford Dills, MD;  Location: [facility];  Service: Cardiovascular;  Laterality: Bilateral;    Family History  Problem Relation Age of Onset   Heart disease Father    COPD Father    Emphysema Father    Stroke Father     Social History:  reports that he quit smoking about 17 years ago. His smoking use included cigarettes. He has been exposed to tobacco smoke. His smokeless tobacco use includes chew. He reports current alcohol use. He reports that he does not use drugs.  Allergies: No Known Allergies  Medications reviewed.  ROS Full ROS performed and is otherwise negative other than what is stated in HPI   BP (!) 150/92   Pulse 93   Temp 98.3 F (36.8 C) (Oral)   Ht 5\' 8"  (1.727 m)   Wt 227 lb 6.4 oz (103.1 kg)   SpO2 100%   BMI 34.58 kg/m   Physical Exam    Assessment/Plan: 66 year old male with prior history of Nissen fundoplication and now clinical symptoms of intractable reflux.  So far work-up has showed an intact wrap and gallstones, w increase LES.  Difficult  situation since he is convinced that his symptoms are back and related to GERD.  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 Given recdent saddle PE that is recurrent I am very hesitant to perform any surgical intervention. I was very honest with him about his current situation.  I will see him back in 6 months.  No need for emergent surgical intervention.  Please note  that I spent greater than 40 minutes in this encounter including coordination of his care, personally reviewing images, placing orders and performing appropriate documentation  70, MD [facility] General Surgeon

## 2022-08-29 ENCOUNTER — Encounter (INDEPENDENT_AMBULATORY_CARE_PROVIDER_SITE_OTHER): Payer: Self-pay

## 2022-09-26 ENCOUNTER — Other Ambulatory Visit: Payer: Self-pay | Admitting: Gastroenterology

## 2022-10-12 ENCOUNTER — Ambulatory Visit (INDEPENDENT_AMBULATORY_CARE_PROVIDER_SITE_OTHER): Payer: Medicare Other | Admitting: Surgery

## 2022-10-12 ENCOUNTER — Ambulatory Visit: Payer: Medicare Other | Admitting: Surgery

## 2022-10-12 ENCOUNTER — Encounter: Payer: Self-pay | Admitting: Surgery

## 2022-10-12 ENCOUNTER — Other Ambulatory Visit: Payer: Self-pay

## 2022-10-12 VITALS — BP 133/100 | HR 98 | Temp 98.2°F | Ht 68.0 in | Wt 226.0 lb

## 2022-10-12 DIAGNOSIS — K449 Diaphragmatic hernia without obstruction or gangrene: Secondary | ICD-10-CM | POA: Diagnosis not present

## 2022-10-12 NOTE — Patient Instructions (Addendum)
Referral sent to Dr.Timothy farrell at [facility]. Someone from his office will call you to schedule an appointment. If you do not hear from his office in a few days please call [phone].    Hiatal Hernia  A hiatal hernia occurs when part of the stomach slides above the muscle that separates the abdomen from the chest (diaphragm). A person can be born with a hiatal hernia (congenital), or it may develop over time. In almost all cases of hiatal hernia, only the top part of the stomach pushes through the diaphragm. Many people have a hiatal hernia with no symptoms. The larger the hernia, the more likely it is that you will have symptoms. In some cases, a hiatal hernia allows stomach acid to flow back into the tube that carries food from your mouth to your stomach (esophagus). This may cause heartburn symptoms. The development of heartburn symptoms may mean that you have a condition called gastroesophageal reflux disease (GERD). What are the causes? This condition is caused by a weakness in the opening (hiatus) where the esophagus passes through the diaphragm to attach to the upper part of the stomach. A person may be born with a weakness in the hiatus, or a weakness can develop over time. What increases the risk? This condition is more likely to develop in: Older people. Age is a major risk factor for a hiatal hernia, especially if you are over the age of 78. Pregnant women. People who are overweight. People who have frequent constipation. What are the signs or symptoms? Symptoms of this condition usually develop in the form of GERD symptoms. Symptoms include: Heartburn. Upset stomach (indigestion). Trouble swallowing. Coughing or wheezing. Wheezing is making high-pitched whistling sounds when you breathe. Sore throat. Chest pain. Nausea and vomiting. How is this diagnosed? This condition may be diagnosed during testing for GERD. Tests that may be done include: X-rays of your  stomach or chest. An upper gastrointestinal (GI) series. This is an X-ray exam of your GI tract that is taken after you swallow a chalky liquid that shows up clearly on the X-ray. Endoscopy. This is a procedure to look into your stomach using a thin, flexible tube that has a tiny camera and light on the end of it. How is this treated? This condition may be treated by: Dietary and lifestyle changes to help reduce GERD symptoms. Medicines. These may include: Over-the-counter antacids. Medicines that make your stomach empty more quickly. Medicines that block the production of stomach acid (H2 blockers). Stronger medicines to reduce stomach acid (proton pump inhibitors). Surgery to repair the hernia, if other treatments are not helping. If you have no symptoms, you may not need treatment. Follow these instructions at home: Lifestyle and activity Do not use any products that contain nicotine or tobacco. These products include cigarettes, chewing tobacco, and vaping devices, such as e-cigarettes. If you need help quitting, ask your health care provider. Try to achieve and maintain a healthy body weight. Avoid putting pressure on your abdomen. Anything that puts pressure on your abdomen increases the amount of acid that may be pushed up into your esophagus. Avoid bending over, especially after eating. Raise the head of your bed by putting blocks under the legs. This keeps your head and esophagus higher than your stomach. Do not wear tight clothing around your chest or stomach. Try not to strain when having a bowel movement, when urinating, or when lifting heavy objects. Eating and drinking Avoid foods that can worsen GERD symptoms. These may  include: Fatty foods, like fried foods. Citrus fruits, like oranges or lemon. Other foods and drinks that contain acid, like orange juice or tomatoes. Spicy food. Chocolate. Eat frequent small meals instead of three large meals a day. This helps prevent  your stomach from getting too full. Eat slowly. Do not lie down right after eating. Do not eat 1-2 hours before bed. Do not drink beverages with caffeine. These include cola, coffee, cocoa, and tea. Do not drink alcohol. General instructions Take over-the-counter and prescription medicines only as told by your health care provider. Keep all follow-up visits. Your health care provider will want to check that any new prescribed medicines are helping your symptoms. Contact a health care provider if: Your symptoms are not controlled with medicines or lifestyle changes. You are having trouble swallowing. You have coughing or wheezing that will not go away. Your pain is getting worse. Your pain spreads to your arms, neck, jaw, teeth, or back. You feel nauseous or you vomit. Get help right away if: You have shortness of breath. You vomit blood. You have bright red blood in your stools. You have black, tarry stools. These symptoms may be an emergency. Get help right away. Call 911. Do not wait to see if the symptoms will go away. Do not drive yourself to the hospital. Summary A hiatal hernia occurs when part of the stomach slides above the muscle that separates the abdomen from the chest. A person may be born with a weakness in the hiatus, or a weakness can develop over time. Symptoms of a hiatal hernia may include heartburn, trouble swallowing, or sore throat. Management of a hiatal hernia includes eating frequent small meals instead of three large meals a day. Get help right away if you vomit blood, have bright red blood in your stools, or have black, tarry stools. This information is not intended to replace advice given to you by your health care provider. Make sure you discuss any questions you have with your health care provider. Document Revised: 12/14/2021 Document Reviewed: 12/14/2021 Elsevier Patient Education  2023 [employer].

## 2022-10-16 NOTE — Progress Notes (Signed)
Outpatient Surgical Follow Up  10/16/2022  Dwayne James is an 66 y.o. male.   Chief Complaint  Patient presents with   Follow-up    Hiatal hernia    HPI: 66 yo  male  following for GERD and ? Recurrent hiatal hernia. Patient reports that for more than two years, he has been experiencing intermittent episodes of severe epigastric pain burning in nature associated with nausea.   He does have reflux with heartburn.   He also reports nocturnal regurgitation , He wishes to stop PPI meds and is interested pursuing antireflux surgery. He has history of Nissen's fundoplication in 2006 by Dr. Renda Rolls, no report is available. HE reports that following Nissen fundopllication his reflux resolved completely and now he has them again. He continues to take Aciphex 20 mg p.o. twice daily.He   Please note that I have personally reviewed.  Since on the CT scan there is suspicious for slipped Nissen. He also recently underwent an upper endoscopy by Dr. Allegra Lai that I have personally reviewed the wrap seems to be partial and it is not clear-cut endoscopically wether or not this is a true slipped fundoplication or not. 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  He also had a history of paroxysmal A-fib and currently is on eliquis and aspirin. He underwent a CT of the abdomen and pelvis as well as a barium swallow that I personally reviewed.  CT show actually an intact wrap to an barium swallow showed no evidence of paraesophageal hernia.  Also CT scan shows gallstones without cholecystitis  He did complete Manometry showing hypertensive LES likely as a result of Nissen.Impaired relation of UES  Past Medical History:  Diagnosis Date   Atrial fibrillation (HCC)    COPD (chronic obstructive pulmonary disease) (HCC)    GERD (gastroesophageal reflux disease)    Hypertension    Prediabetes    Seizure (HCC) 2009   x 1, pt reports it was related to an  antidepressant he was taking    Past Surgical History:  Procedure Laterality Date   ANKLE ARTHROSCOPY Left 2001   ATRIAL FIBRILLATION ABLATION  04/2020   CARDIOVERSION     x 2   COLONOSCOPY WITH PROPOFOL N/A 06/17/2021   Procedure: COLONOSCOPY WITH PROPOFOL;  Surgeon: Toney Reil, MD;  Location: [facility];  Service: Endoscopy;  Laterality: N/A;   ESOPHAGOGASTRODUODENOSCOPY (EGD) WITH PROPOFOL N/A 08/11/2021   Procedure: ESOPHAGOGASTRODUODENOSCOPY (EGD) WITH PROPOFOL;  Surgeon: Toney Reil, MD;  Location: [facility];  Service: Gastroenterology;  Laterality: N/A;   KNEE ARTHROSCOPY Right 2009   NISSEN FUNDOPLICATION  2006   PULMONARY THROMBECTOMY N/A 10/16/2018   Procedure: PULMONARY THROMBECTOMY;  Surgeon: Renford Dills, MD;  Location: [facility] INVASIVE CV LAB;  Service: Cardiovascular;  Laterality: N/A;   PULMONARY THROMBECTOMY Bilateral 02/25/2022   Procedure: PULMONARY THROMBECTOMY;  Surgeon: Renford Dills, MD;  Location: [facility] INVASIVE CV LAB;  Service: Cardiovascular;  Laterality: Bilateral;    Family History  Problem Relation Age of Onset   Heart disease Father    COPD Father    Emphysema Father    Stroke Father     Social History:  reports that he quit smoking about 18 years ago. His smoking use included cigarettes. He has been exposed to tobacco smoke. His smokeless tobacco use includes chew. He reports current alcohol use. He reports that he does not use drugs.  Allergies: No Known Allergies  Medications reviewed.  ROS Full ROS performed and is otherwise negative other than what is stated in HPI   BP (!) 133/100   Pulse 98   Temp 98.2 F (36.8 C) (Oral)   Ht 5\' 8"  (1.727 m)   Wt 226 lb (102.5 kg)   SpO2 98%   BMI 34.36 kg/m   Physical Exam Vitals and nursing note reviewed.  Constitutional:      General: He is not in acute distress.    Appearance: He is obese. He is not ill-appearing.  Cardiovascular:     Rate and  Rhythm: Normal rate and regular rhythm.  Abdominal:     General: Abdomen is flat. There is no distension.     Palpations: Abdomen is soft. There is no mass.     Tenderness: There is no abdominal tenderness. There is no guarding or rebound.     Hernia: No hernia is present.  Musculoskeletal:        General: Normal range of motion.     Cervical back: Normal range of motion and neck supple. No rigidity or tenderness.  Skin:    General: Skin is warm and dry.     Capillary Refill: Capillary refill takes less than 2 seconds.     Coloration: Skin is not jaundiced or pale.  Neurological:     General: No focal deficit present.     Mental Status: He is alert and oriented to person, place, and time.  Psychiatric:        Mood and Affect: Mood normal.        Behavior: Behavior normal.    Assessment/Plan: 66 year old male with reflux symptoms.  He does not had an evidence of prior Nissen fundoplication and the wrap seems to be partially intact.  More importantly his BMI is close to 35 and he is got significant medical comorbidities including a recent PE with prolonged hospitalization.  An extensive discussion with the patient regarding my thought process.  I have too many hesitations 1 is that he is BMI and #2 his recent PE and the lack of physiological reserve.  I also discussed with him that I cannot 100% sure that performing a redo Nissen would be the right answer.  I encouraged him to seek a second opinion and arrangement will be done for him to see Dr. 79 at [facility] for second opinion  Please note that I spent 40 minutes in this encounter including personally reviewing imaging studies, coordinating his care, placing orders and performing appropriate documentation.    [facility], MD [facility] General Surgeon

## 2023-10-29 IMAGING — US US ABDOMEN LIMITED
1 series · 14 of 25 positions shown · non-contrast
Comparison: CT abdomen pelvis 01/12/2022.

CLINICAL DATA: Abdominal pain.

EXAM:
ULTRASOUND ABDOMEN LIMITED RIGHT UPPER QUADRANT

[Series 1: us abdomen limited · 0.17mm/px · 32 acquisitions, 14 frames shown]
[im 1/32]
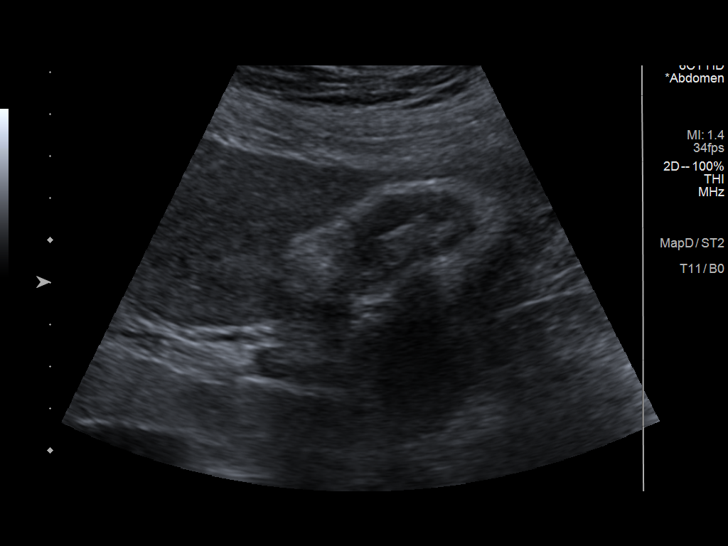
[im 3/32]
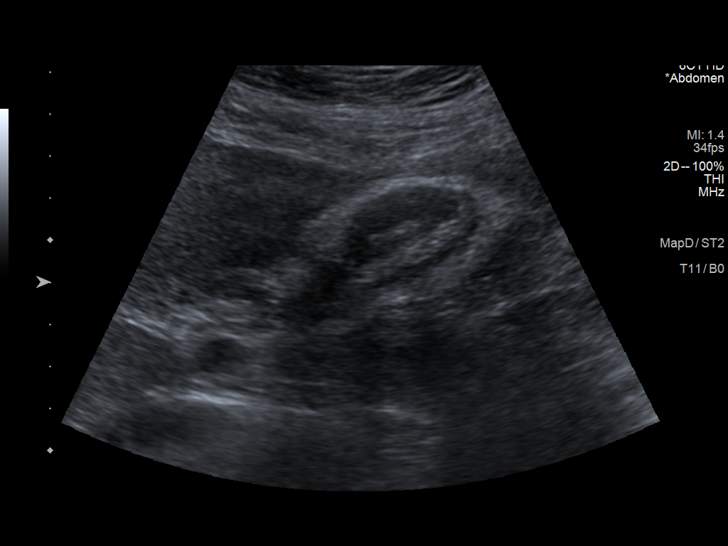
[im 6/32]
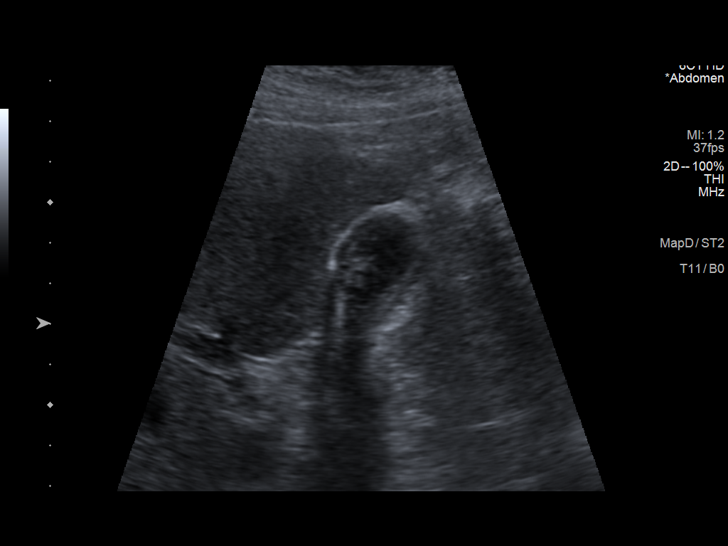
[im 8/32]
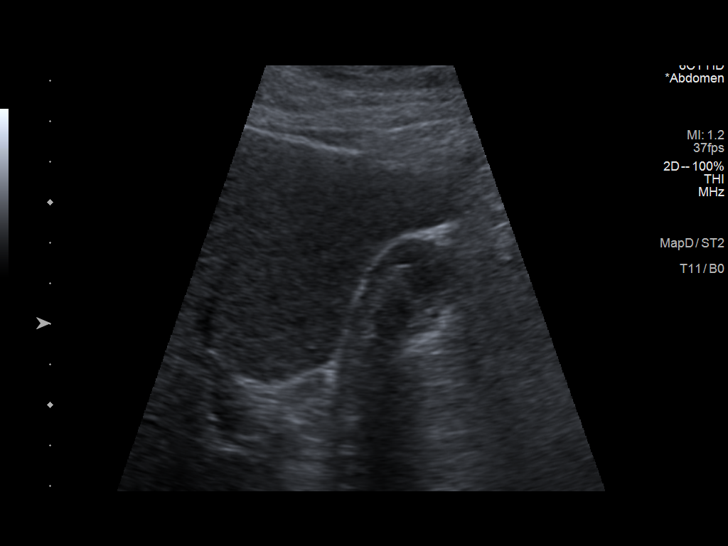
[im 11/32]
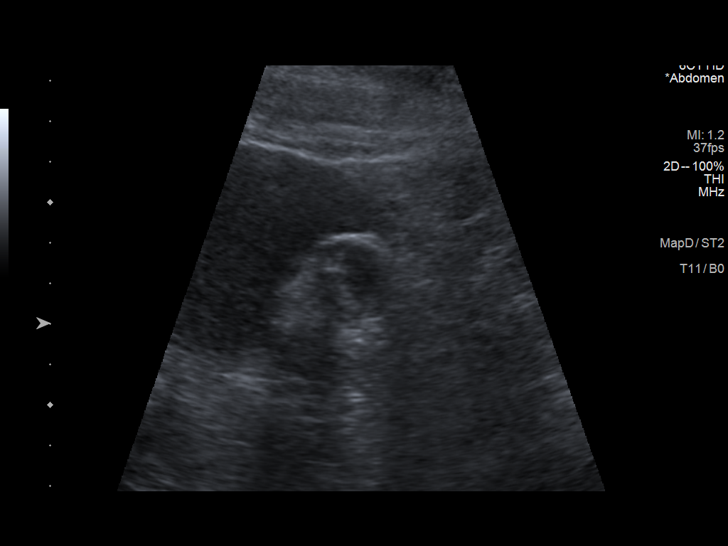
[im 12/32]
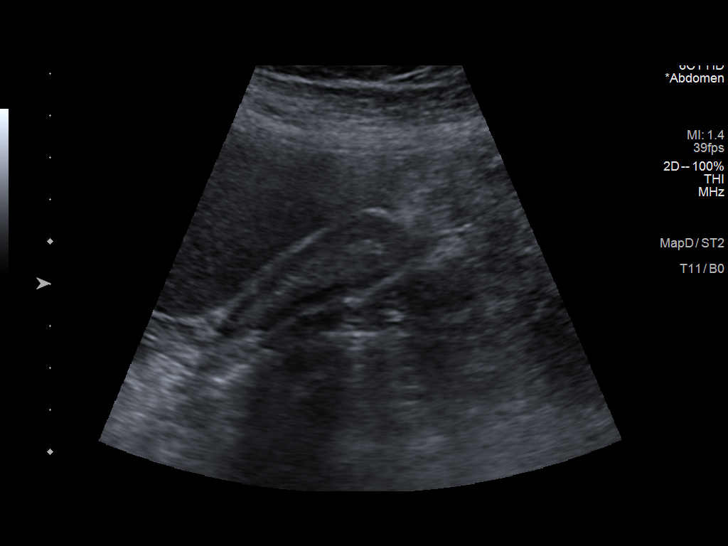
[im 15/32]
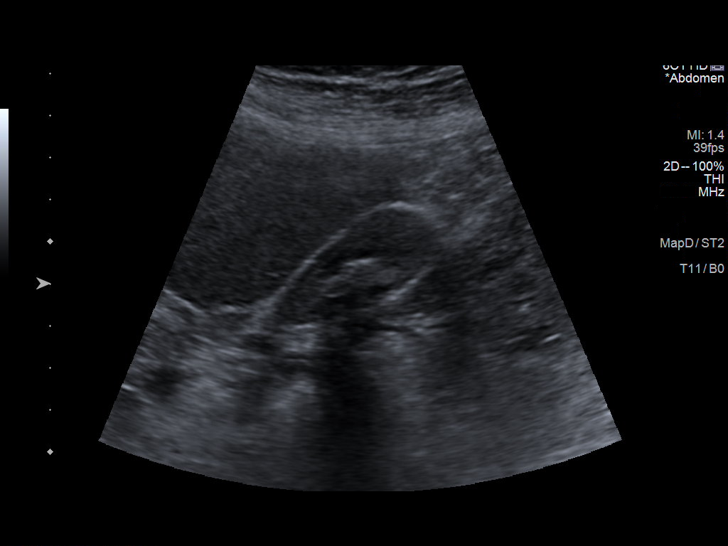
[im 17/32]
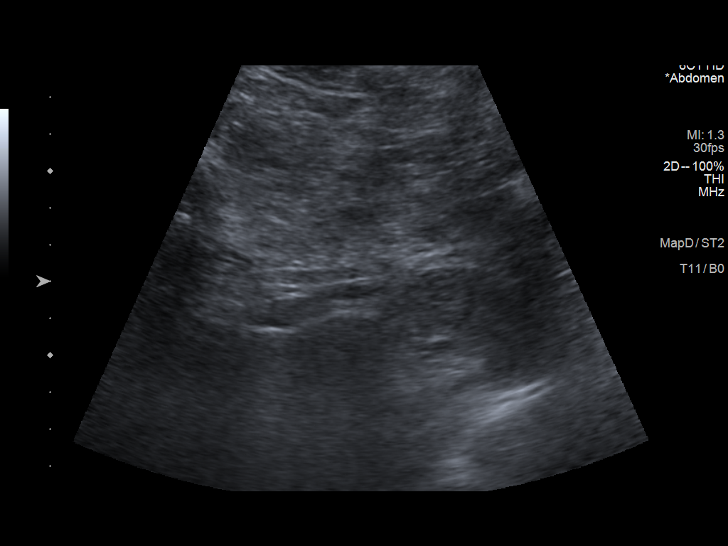
[im 20/32]
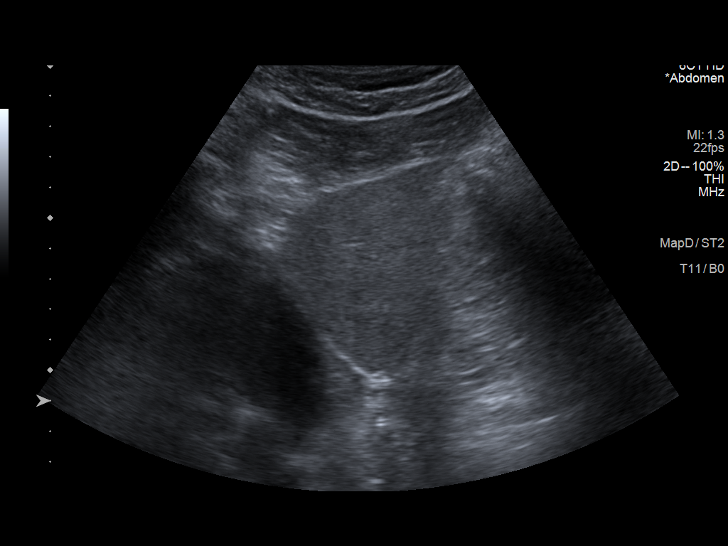
[im 21/32]
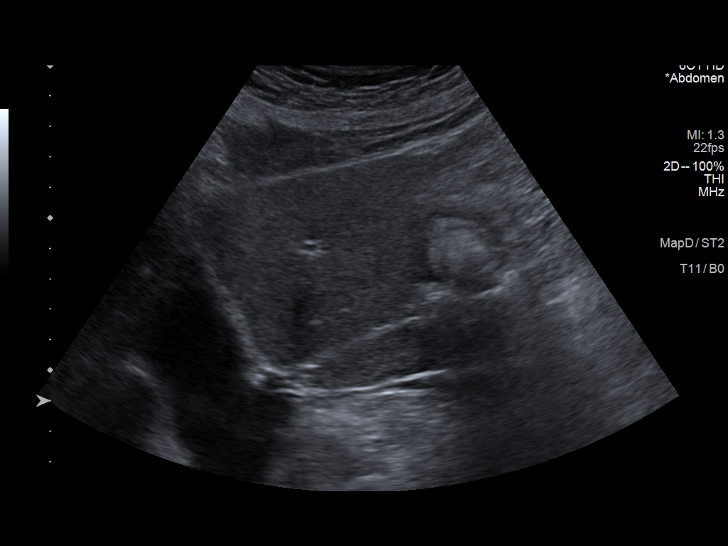
[im 24/32]
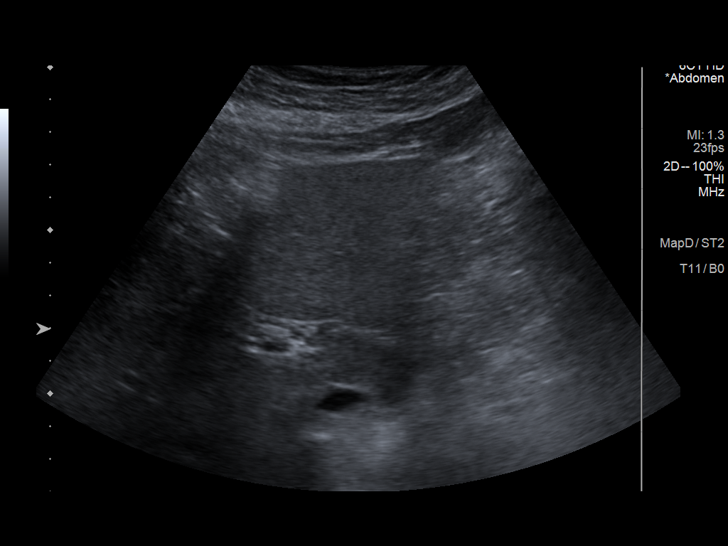
[im 26/32]
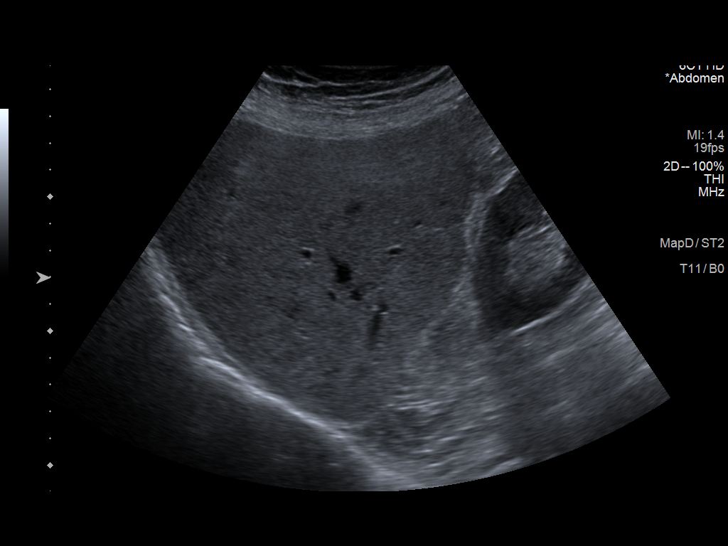
[im 29/32]
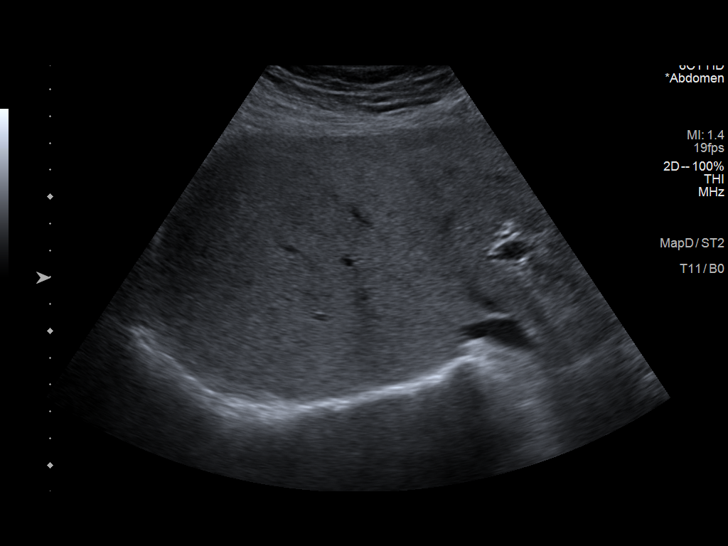
[im 32/32]
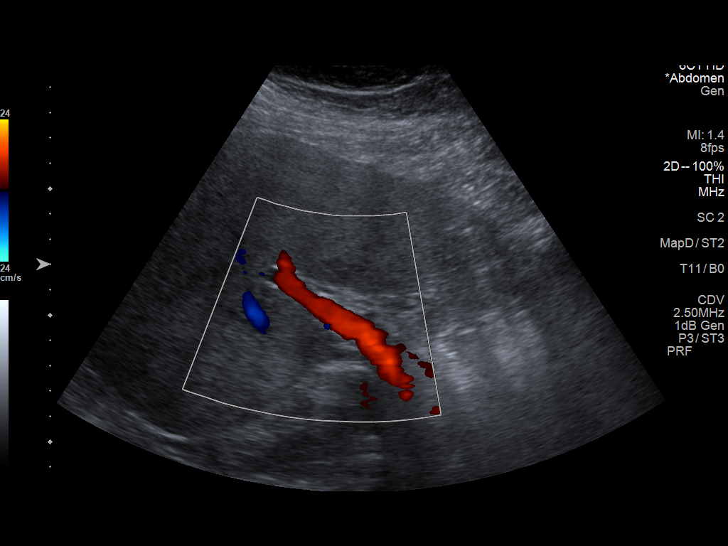

[14 of 25 positions shown; findings below may reference images not displayed]

FINDINGS: Gallbladder:

Contains sludge in stones measuring up to 9 mm. No wall thickening.
Negative sonographic Murphy sign. No pericholecystic fluid.

Common bile duct:

Diameter: 4 mm, within normal limits. No intrahepatic biliary ductal
dilatation.

Liver:

Diffusely increased in echogenicity. No focal lesion. Portal vein is
patent on color Doppler imaging with normal direction of blood flow
towards the liver.

Other: None.
IMPRESSION: 1. Gallbladder sludge and stones.  No acute findings.
2. Hepatic steatosis.

## 2023-11-05 IMAGING — CR DG CHEST 2V
2 series · 2 of 2 positions shown · non-contrast
Comparison: 10/16/2018

CLINICAL DATA: Shortness of breath

EXAM:
CHEST - 2 VIEW

[chest lat]
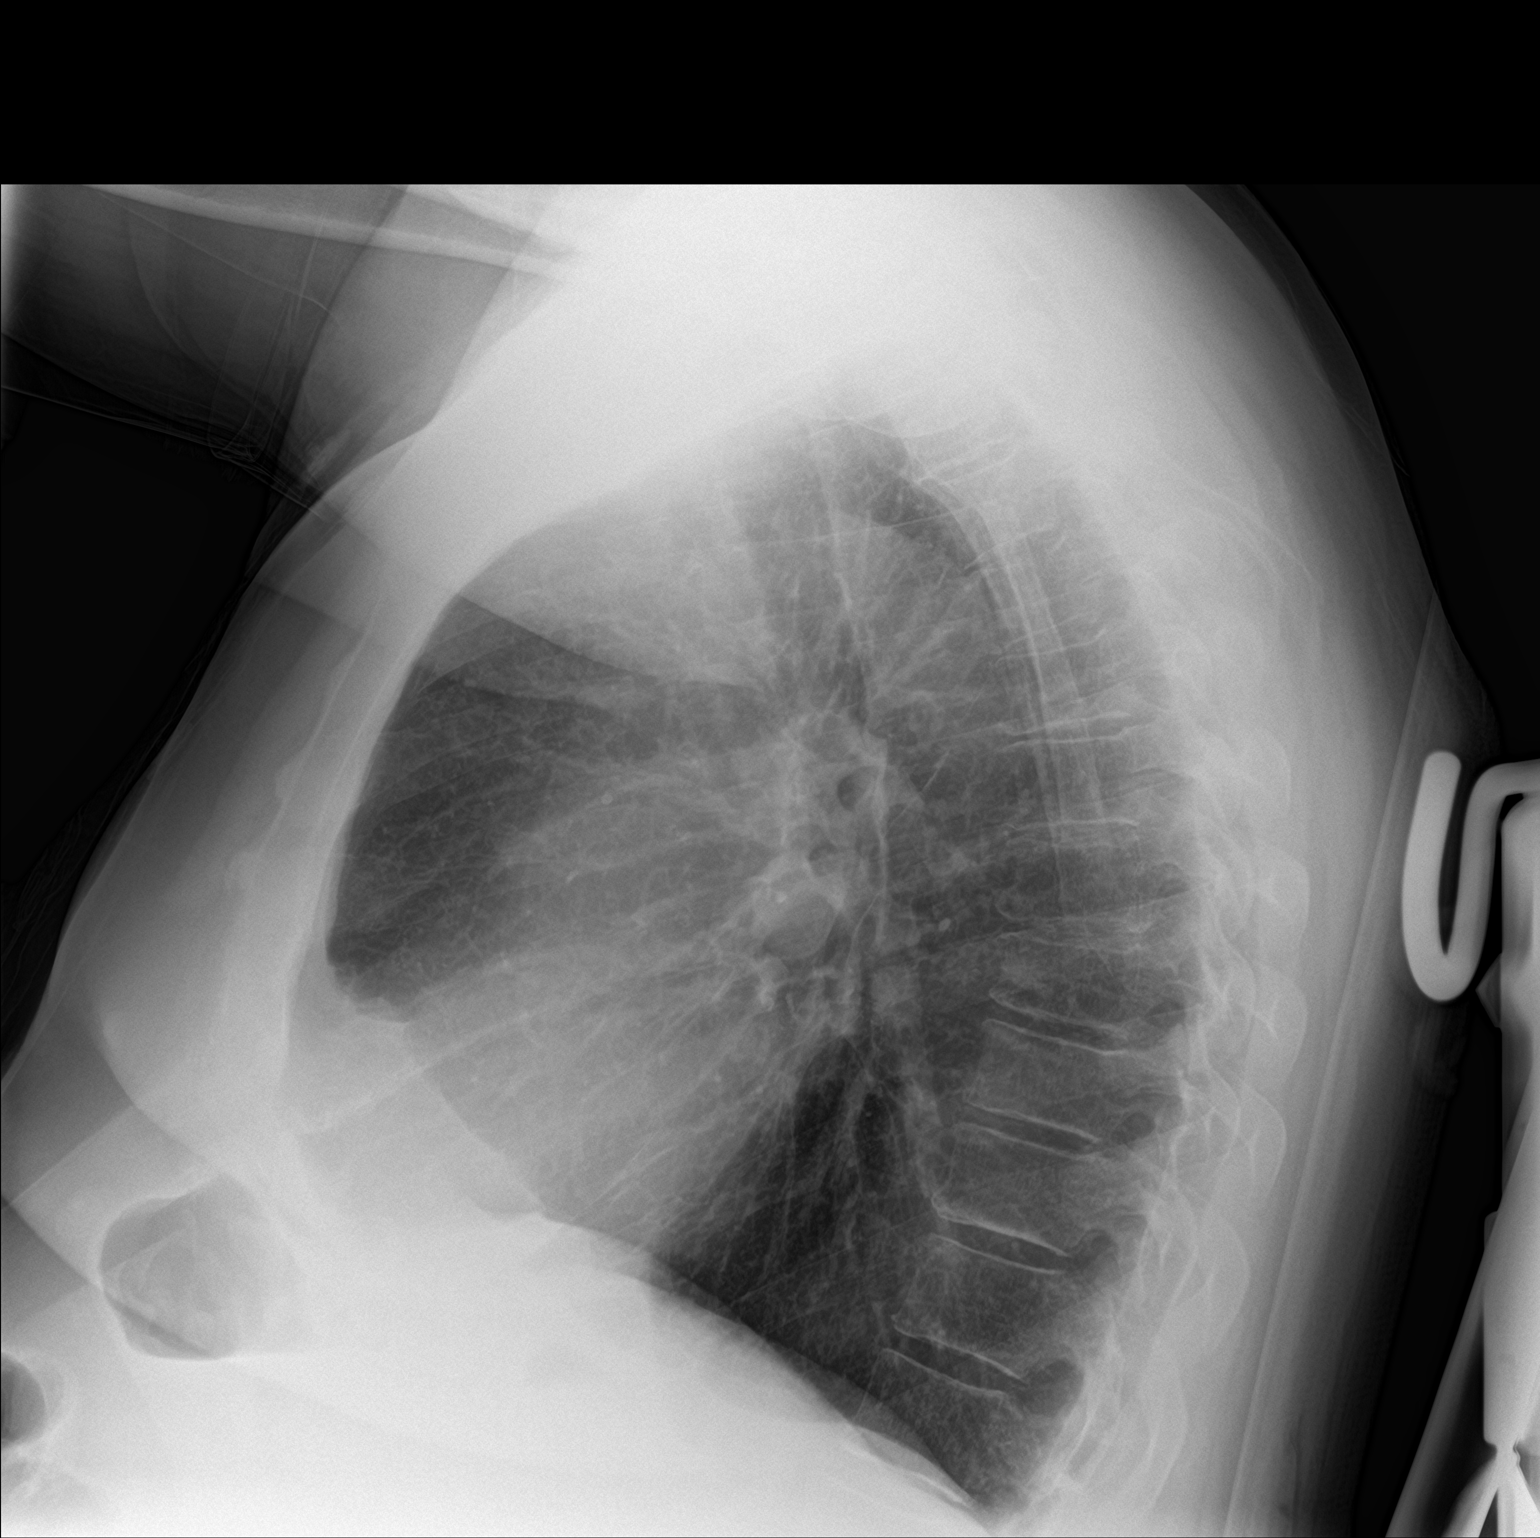

[chest ap]
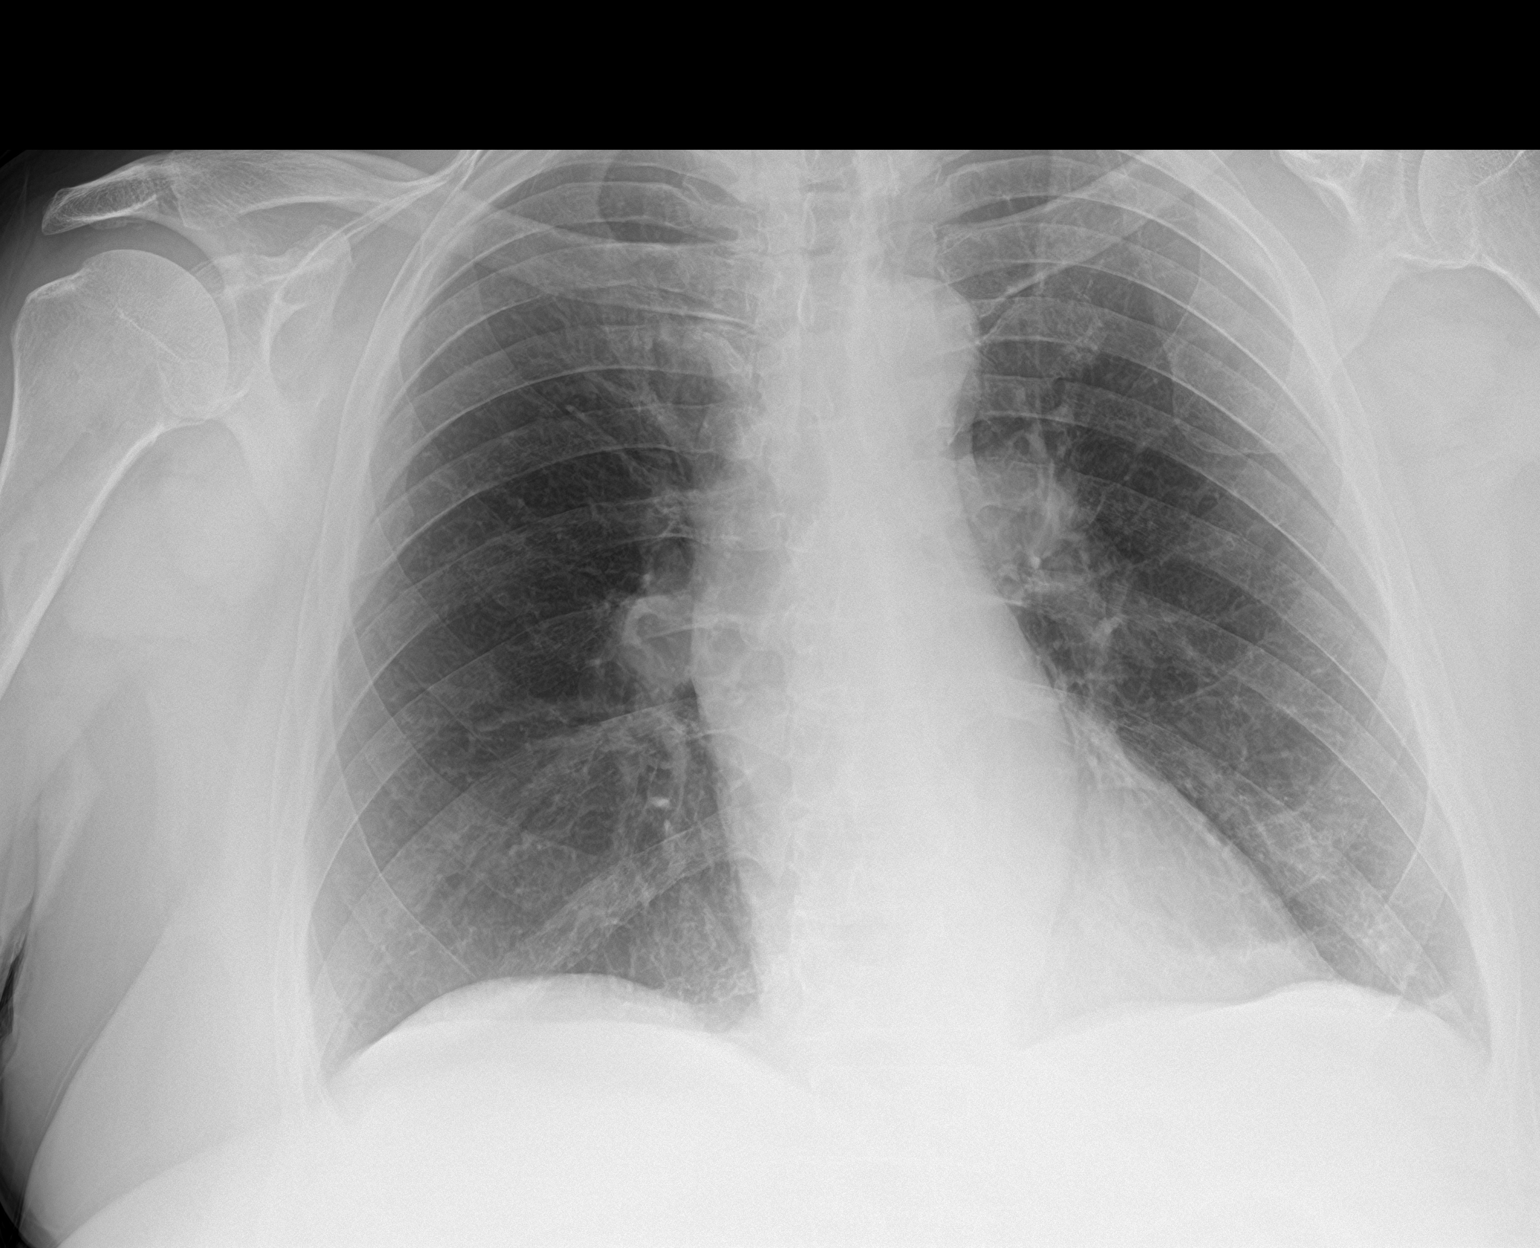

[2 of 2 positions shown; findings below may reference images not displayed]

FINDINGS: The heart size and mediastinal contours are within normal limits. No
focal airspace consolidation, pleural effusion, or pneumothorax. The
visualized skeletal structures are unremarkable.
IMPRESSION: No active cardiopulmonary disease.

## 2024-05-27 ENCOUNTER — Encounter: Payer: Self-pay | Admitting: Cardiology

## 2024-05-27 ENCOUNTER — Ambulatory Visit: Attending: Cardiology | Admitting: Cardiology

## 2024-05-27 VITALS — BP 123/86 | HR 132 | Ht 67.0 in | Wt 263.2 lb

## 2024-05-27 DIAGNOSIS — E1169 Type 2 diabetes mellitus with other specified complication: Secondary | ICD-10-CM | POA: Insufficient documentation

## 2024-05-27 DIAGNOSIS — E782 Mixed hyperlipidemia: Secondary | ICD-10-CM | POA: Diagnosis present

## 2024-05-27 DIAGNOSIS — I5032 Chronic diastolic (congestive) heart failure: Secondary | ICD-10-CM | POA: Insufficient documentation

## 2024-05-27 DIAGNOSIS — I484 Atypical atrial flutter: Secondary | ICD-10-CM | POA: Insufficient documentation

## 2024-05-27 DIAGNOSIS — I480 Paroxysmal atrial fibrillation: Secondary | ICD-10-CM | POA: Diagnosis not present

## 2024-05-27 DIAGNOSIS — R600 Localized edema: Secondary | ICD-10-CM | POA: Insufficient documentation

## 2024-05-27 DIAGNOSIS — I10 Essential (primary) hypertension: Secondary | ICD-10-CM | POA: Insufficient documentation

## 2024-05-27 DIAGNOSIS — Z86711 Personal history of pulmonary embolism: Secondary | ICD-10-CM | POA: Diagnosis present

## 2024-05-27 MED ORDER — METOPROLOL SUCCINATE ER 100 MG PO TB24: 100.0000 mg | ORAL_TABLET | Freq: Every day | ORAL | 3 refills | Status: DC

## 2024-05-27 MED ORDER — RIVAROXABAN 20 MG PO TABS: 20.0000 mg | ORAL_TABLET | Freq: Every day | ORAL | Status: AC

## 2024-05-27 MED ORDER — TIRZEPATIDE 7.5 MG/0.5ML ~~LOC~~ SOAJ: 7.5000 mg | SUBCUTANEOUS | Status: DC

## 2024-05-27 NOTE — H&P (View-Only) (Signed)
 Cardiology Office Note   Date:  05/27/2024  ID:  Dwayne James, DOB 1956-02-25, MRN [id number] PCP: Harriette PONCE Dwayne James, Dwayne James  [facility] Providers Cardiologist:  None Cardiology APP:  Gerard Frederick, NP     History of Present Illness Dwayne James is a 68 y.o. 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    Previously had been followed by Dr Charlanne at [facility].  He had previously been found to be in persistent atrial fibrillation with intermittent RVR where he was extremely symptomatic in 2021.  He underwent TEE/DCCV 12/25/2019 with an LVEF of 40%, LAA without thrombus, bubble study negative for shunting, no significant valvular abnormalities.  Underwent successful DCCV with single shock 120 J with conversion to normal sinus rhythm.  Unfortunately recurrent atrial fibrillation the next day.  He underwent nuclear stress testing in 12/2019 which revealed small fixed apical defect likely artifact.  No ischemia, EF 40% with global hypokinesis.  He was noted to have a CHA2DS2-VASc score of 5 and had previously been on apixaban  but discontinued apixaban  on his own due to co-pay.  In April 2021 he underwent successful DCCV with recurrent atrial fibrillation noted 03/13/2024.  His digoxin was increased to 250 mcg daily.  He continued to remain asymptomatic with persistent atrial fibrillation and intermittent episodes of RVR.  He was loaded on amiodarone and was referred to EP for ablation procedure.  In July 2021 he was status post EP study with a cryoballoon ablation CTI/PVI, additional ablation in the left atrial roofline, proximal to mid coronary sinus, appendage side between veins and by Dr.  Perri.  There was no reoccurrence of atrial fibrillation after his ablation procedure.  History noted of PE 09/2018 requiring suction removal.  He was noncompliant with anticoagulation (had declined warfarin and was only taken aspirin  therapy) and had recurrent saddle PE in April 2023 while off of OAC and only amendable to aspirin  therapy.  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  Hepatic continued on rivaroxaban  since that time.  Repeat echocardiogram revealed an LVEF of 60-65%, no RWMA, G1 DD, enlarged RV with moderately reduced RV systolic function, moderately dilated RA, mild MR, and normal inferior vena cava size.  He was last evaluated by cardiology 06/16/2023 stating he was back to his usual state of health.  He had stopped taking Ozempic since he did not see benefit.  His weight had increased back from 12/05/2018 2-50.  Reported depression symptoms were well-controlled with current medications and routine therapy.  Remains stable functionally.  Continues to deny any recurrent exertional chest discomfort, shortness of breath, palpitations, nausea/vomiting, lightheadedness, dizziness, or syncope.  Had moderate persistent lower extremity edema for few months.  Had not taken Lasix in quite some time.  Denies any significant OSA symptoms.  Continued to drink alcohol daily and also stated he was compliant with oral anticoagulant therapy.  He was encouraged to minimize his alcohol intake.  Continued on metoprolol  succinate 50 mg daily, rivaroxaban  20 mg daily, and scheduled for routine labs.  There were no other procedures that were ordered at that time.  He was also encouraged to take Lasix 20 mg daily for 3 days and  then as needed.  Results of his BNP and BNP returned and he was advised to participate in conservative therapy that chronic diuretic therapy was not indicated.    He presents to clinic today to establish care.  Presents today with elevated heart rates that been ongoing for the last 1 to 2 months.  He states that he uploads his blood pressure and his heart rate to his PCPs office and they were concerned about his elevated heart rates.  He was mailed a ZIO monitor which he has on today and is metoprolol  was increased to 75 mg daily.  He continues to have swelling to his lower extremities but denies any changes to his breathing.  Denies any chest pain or palpitations.  Occasionally does note lightheadedness and dizziness with concerns of near syncope.  He states that he is very sedentary at baseline.  States that he has not missed any of his rivaroxaban  and denies any bleeding with any blood noted in his urine or stool.  Denies any recent hospitalizations or visits to the emergency department.  ROS: 10 point review of systems has been reviewed and considered negative except ones been listed in the HPI  Studies Reviewed EKG Interpretation Date/Time:  Monday May 27 2024 10:12:54 EDT Ventricular Rate:  132 PR Interval:    QRS Duration:  162 QT Interval:  312 QTC Calculation: 462 R Axis:   -26  Text Interpretation: Atrial flutter with variable A-V block with premature ventricular or aberrantly conducted complexes Non-specific intra-ventricular conduction block When compared with ECG of 24-Feb-2022 13:21, Atrial flutter has replaced Sinus rhythm QRS duration has increased Confirmed by Gerard Frederick ([id number]) on 05/27/2024 10:17:04 AM    2D echo 02/25/2022 1. Left ventricular ejection fraction, by estimation, is 60 to 65%. The  left ventricle has normal function. The left ventricle has no regional  wall motion abnormalities. Left ventricular diastolic parameters are  consistent with Grade I diastolic  dysfunction (impaired relaxation).   2. Right ventricular systolic function is moderately reduced. The right  ventricular size is severely enlarged. There is  normal pulmonary artery  systolic pressure. The estimated right ventricular systolic pressure is  34.6 mmHg.   3. Right atrial size was moderately dilated.   4. The mitral valve is normal in structure. Mild mitral valve  regurgitation.   5. The aortic valve is tricuspid. Aortic valve regurgitation is not  visualized. Aortic valve sclerosis is present, with no evidence of aortic  valve stenosis.   6. The inferior vena cava is normal in size with greater than 50%  respiratory variability, suggesting right atrial pressure of 3 mmHg.   S/p TEE/DCCV 12/25/2019:  EF 40%, LAA without thrombus, bubble study negative for shunting, no significant valvular abnormalities. Underwent successful DCCV with single shock of 120J with conversion to NSR. Recurrent Afib next day on 12/26/19.  Nuclear stress test 12/25/2019:  Small fixed apical defect likely artifact. No ischemia. EF 40% with globally  s/p successful DCCV 02/28/20.  Recurrent Afib 03/13/20. Digoxin increased to 250mcg po daily  05/05/20: s/p EP study and cryoballoon ablation CTI/PVI,  additional ablation in left atrial roof line, proximal and mid coronary sinus, appendage side between veins by Dr. Perri.   2D echo 10/16/2018 Study Conclusions  - Left ventricle: The cavity size was normal. Wall thickness was    normal. Systolic function was normal. The estimated ejection    fraction was in the range of 55% to 65%.  - Mitral valve: There was mild regurgitation.  Risk Assessment/Calculations  CHA2DS2-VASc Score = 4   This indicates a 4.8% annual risk of stroke. The patient's score is based upon: CHF History: 1 HTN History: 1 Diabetes History: 1 Stroke History: 0 Vascular Disease History: 0 Age Score: 1 Gender Score: 0            Physical Exam VS:  BP 123/86 (BP Location: Left Arm, Patient Position: Sitting, Cuff Size: Large)   Pulse (!) 132   Ht 5' 7 (1.702 m)   Wt 263 lb 3.2 oz (119.4 kg)   SpO2 98%   BMI 41.22 kg/m         Wt Readings from Last 3 Encounters:  05/27/24 263 lb 3.2 oz (119.4 kg)  10/12/22 226 lb (102.5 kg)  04/13/22 227 lb 6.4 oz (103.1 kg)    GEN: Well nourished, well developed in no acute distress NECK: No JVD; No carotid bruits CARDIAC: IR IR, no murmurs, rubs, gallops RESPIRATORY:  Clear to auscultation without rales, wheezing or rhonchi  ABDOMEN: Soft, non-tender, obese, non-distended EXTREMITIES:  1+ edema BLE; No deformity, multiple scabbed areas from wounds on his lower extremities  ASSESSMENT AND PLAN New onset atrial flutter with a history paroxysmal atrial fibrillation status post ablation in 2021 and multiple DCCV's.  States that he has been having elevated heart rates for the last 1 to 2 months and his PCP placed him on a ZIO monitor which she has on today.  EKG today reveals atrial flutter with varying A-V block with a rate of 132.  He states that he has not missed any of his rivaroxaban  and takes 20 mg daily.  Discussed direct-current cardioversion procedure and patient is agreeable.  Monitor for will need to be placed on hold for the week of his procedure.  He has been sent for labs today of CBC, BMP, mag, and TSH.  He is also being scheduled for an updated echocardiogram for the new onset atrial flutter.  If after he continues to remain in atrial flutter he will need referral back to EP for potential a flutter ablation.  He stated that his Toprol -XL was recently increased to 75 mg by his PCP but is being increased to 100 mg today with a new prescription sent to the pharmacy of patient's choice.  He is continued on rivaroxaban  20 mg daily for CHA2DS2-VASc score of at least 4 for stroke prophylaxis.  Of note previously patient had been maintained on amiodarone and digoxin prior to his last ablation procedure.  HFimpEF with last echocardiogram revealed an LVEF that improved from 40% to 60-65%, no RWMA, G1 DD.  He denies any recurrent shortness of breath.  Is currently on losartan 50 mg  daily, furosemide 20 mg as needed.  He has been scheduled for an updated echocardiogram.  Medication adjustments will be determined after follow-up study as his last study was completed in 2023.  He has been encouraged to continue to weigh self daily as he primarily does weekly now.  He has been advised for weight increase of 2 to 3 pounds from 1 morning to the next dose of the days he needs to take his furosemide and that he is at higher risk going back in to heart failure with being in atrial flutter with elevated heart rates.  Recurrent pulmonary embolism where he previously had a saddle PE and underwent thrombectomy and was followed by vascular.  He has continued on rivaroxaban  20 mg daily.  Primary hypertension with a blood pressure today of 123/86  and repeat 129/91.  He has continued on furosemide 20 mg as needed, losartan 50 mg daily, and Toprol -XL 100 mg daily.  He has been encouraged to continue to monitor his pressure 1 to 2 hours postmedication administration as well.  Mixed hyperlipidemia with associated type 2 diabetes which is continued on ezetimibe  10 mg daily.  We have requested his most recent labs from his PCP.  Ongoing management per PCP.  Morbid obesity with a BMI of 41.22.  He has been continued on Mounjaro .  Ongoing management of his medication by his PCP.  He has been encouraged to make dietary changes as well.  Bilateral lower extremity edema likely venous insufficiency where he has been encouraged to participate in conservative therapy of elevating his extremities, foot calf pumps, decreasing his sodium intake, and using his furosemide only as needed basis related to sliding scale weights.  He would benefit from compression stockings but he has healing wounds to bilateral lower extremities and would not recommend stockings at this time.  Continue with conservative therapy as described above.    Informed Consent   Shared Decision Making/Informed Consent The risks (stroke,  cardiac arrhythmias rarely resulting in the need for a temporary or permanent pacemaker, skin irritation or burns and complications associated with conscious sedation including aspiration, arrhythmia, respiratory failure and death), benefits (restoration of normal sinus rhythm) and alternatives of a direct current cardioversion were explained in detail to Mr. Sebastian and he agrees to proceed.       Dispo: Patient to return to clinic to see Dwayne James/APP in 2 to 3 weeks postprocedure or sooner if needed.  Signed, Crystalann Korf, NP

## 2024-05-27 NOTE — Patient Instructions (Signed)
 Medication Instructions:  Your physician recommends the following medication changes.  INCREASE: Metoprolol  to 100 mg daily  *If you need a refill on your cardiac medications before your next appointment, please call your pharmacy*  Lab Work: Your provider would like for you to have following labs drawn today CBC, BMP, TSH, Mag.   If you have labs (blood work) drawn today and your tests are completely normal, you will receive your results only by: MyChart Message (if you have MyChart) OR A paper copy in the mail If you have any lab test that is abnormal or we need to change your treatment, we will call you to review the results.  Testing/Procedures:    Dear Dwayne James  You are scheduled for a Cardioversion on Wednesday, August 6 with Dr. Gollan.  Please arrive at the [facility] Entrance of [facility], [address], [place] [address] at 7:00 AM (This is 1 hour(s) prior to your procedure time).  Proceed to the Check-In Desk directly inside the entrance.  Procedure Parking: Use the entrance off of the [address] side of the hospital. Turn right upon entering and follow the driveway to parking that is directly in front of the [facility] Vascular Center. There is no valet parking available at this entrance, however there is an awning directly in front of the Heart & Vascular Center for drop off/ pick up for patients.   DIET:  Nothing to eat or drink after midnight except a sip of water  with medications (see medication instructions below)  MEDICATION INSTRUCTIONS: !!IF ANY NEW MEDICATIONS ARE STARTED AFTER TODAY, PLEASE NOTIFY YOUR PROVIDER AS SOON AS POSSIBLE!!  Hold Mounjaro  for 7 days prior to cardioversion  Continue taking your anticoagulant (blood thinner): Rivaroxaban  (Xarelto ).  You will need to continue this after your procedure until you are told by your provider that it is safe to stop.    LABS:  Come to [facility] 05/27/24 for CBC, BMP, TSH,  Mag  FYI:  For your safety, and to allow us  to monitor your vital signs accurately during the surgery/procedure we request: If you have artificial nails, gel coating, SNS etc, please have those removed prior to your surgery/procedure. Not having the nail coverings /polish removed may result in cancellation or delay of your surgery/procedure.  Your support person will be asked to wait in the waiting room during your procedure.  It is OK to have someone drop you off and come back when you are ready to be discharged.  You cannot drive after the procedure and will need someone to drive you home.  Bring your insurance cards.  *Special Note: Every effort is made to have your procedure done on time. Occasionally there are emergencies that occur at the hospital that may cause delays. Please be patient if a delay does occur.    Your physician has requested that you have an echocardiogram. Echocardiography is a painless test that uses sound waves to create images of your heart. It provides your doctor with information about the size and shape of your heart and how well your heart's chambers and valves are working.   You may receive an ultrasound enhancing agent through an IV if needed to better visualize your heart during the echo. This procedure takes approximately one hour.  There are no restrictions for this procedure.  This will take place at [address] [facility] [address], [place] [address]  Please note: We ask at that you not bring  children with you during ultrasound (echo/ vascular) testing. Due to room size and safety concerns, children are not allowed in the ultrasound rooms during exams. Our front office staff cannot provide observation of children in our lobby area while testing is being conducted. An adult accompanying a patient to their appointment will only be allowed in the ultrasound room at the discretion of the ultrasound technician under special circumstances. We  apologize for any inconvenience.   Follow-Up: At [facility], you and your health needs are our priority.  As part of our continuing mission to provide you with exceptional heart care, our providers are all part of one team.  This team includes your primary Cardiologist (physician) and Advanced Practice Providers or APPs (Physician Assistants and Nurse Practitioners) who all work together to provide you with the care you need, when you need it.  Your next appointment:   2 - 3 week(s)  Provider:   Tylene Lunch, NP    We recommend signing up for the patient portal called MyChart.  Sign up information is provided on this After Visit Summary.  MyChart is used to connect with patients for Virtual Visits (Telemedicine).  Patients are able to view lab/test results, encounter notes, upcoming appointments, etc.  Non-urgent messages can be sent to your provider as well.   To learn more about what you can do with MyChart, go to [email or web address].

## 2024-05-27 NOTE — Progress Notes (Signed)
 Cardiology Office Note   Date:  05/27/2024  ID:  Dwayne James, DOB 1956-02-25, MRN [id number] PCP: Harriette PONCE Norleen LELON, MD  [facility] Providers Cardiologist:  None Cardiology APP:  Gerard Frederick, NP     History of Present Illness Dwayne James is a 68 y.o. 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    Previously had been followed by Dr Charlanne at [facility].  He had previously been found to be in persistent atrial fibrillation with intermittent RVR where he was extremely symptomatic in 2021.  He underwent TEE/DCCV 12/25/2019 with an LVEF of 40%, LAA without thrombus, bubble study negative for shunting, no significant valvular abnormalities.  Underwent successful DCCV with single shock 120 J with conversion to normal sinus rhythm.  Unfortunately recurrent atrial fibrillation the next day.  He underwent nuclear stress testing in 12/2019 which revealed small fixed apical defect likely artifact.  No ischemia, EF 40% with global hypokinesis.  He was noted to have a CHA2DS2-VASc score of 5 and had previously been on apixaban  but discontinued apixaban  on his own due to co-pay.  In April 2021 he underwent successful DCCV with recurrent atrial fibrillation noted 03/13/2024.  His digoxin was increased to 250 mcg daily.  He continued to remain asymptomatic with persistent atrial fibrillation and intermittent episodes of RVR.  He was loaded on amiodarone and was referred to EP for ablation procedure.  In July 2021 he was status post EP study with a cryoballoon ablation CTI/PVI, additional ablation in the left atrial roofline, proximal to mid coronary sinus, appendage side between veins and by Dr.  Perri.  There was no reoccurrence of atrial fibrillation after his ablation procedure.  History noted of PE 09/2018 requiring suction removal.  He was noncompliant with anticoagulation (had declined warfarin and was only taken aspirin  therapy) and had recurrent saddle PE in April 2023 while off of OAC and only amendable to aspirin  therapy.  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  Hepatic continued on rivaroxaban  since that time.  Repeat echocardiogram revealed an LVEF of 60-65%, no RWMA, G1 DD, enlarged RV with moderately reduced RV systolic function, moderately dilated RA, mild MR, and normal inferior vena cava size.  He was last evaluated by cardiology 06/16/2023 stating he was back to his usual state of health.  He had stopped taking Ozempic since he did not see benefit.  His weight had increased back from 12/05/2018 2-50.  Reported depression symptoms were well-controlled with current medications and routine therapy.  Remains stable functionally.  Continues to deny any recurrent exertional chest discomfort, shortness of breath, palpitations, nausea/vomiting, lightheadedness, dizziness, or syncope.  Had moderate persistent lower extremity edema for few months.  Had not taken Lasix in quite some time.  Denies any significant OSA symptoms.  Continued to drink alcohol daily and also stated he was compliant with oral anticoagulant therapy.  He was encouraged to minimize his alcohol intake.  Continued on metoprolol  succinate 50 mg daily, rivaroxaban  20 mg daily, and scheduled for routine labs.  There were no other procedures that were ordered at that time.  He was also encouraged to take Lasix 20 mg daily for 3 days and  then as needed.  Results of his BNP and BNP returned and he was advised to participate in conservative therapy that chronic diuretic therapy was not indicated.    He presents to clinic today to establish care.  Presents today with elevated heart rates that been ongoing for the last 1 to 2 months.  He states that he uploads his blood pressure and his heart rate to his PCPs office and they were concerned about his elevated heart rates.  He was mailed a ZIO monitor which he has on today and is metoprolol  was increased to 75 mg daily.  He continues to have swelling to his lower extremities but denies any changes to his breathing.  Denies any chest pain or palpitations.  Occasionally does note lightheadedness and dizziness with concerns of near syncope.  He states that he is very sedentary at baseline.  States that he has not missed any of his rivaroxaban  and denies any bleeding with any blood noted in his urine or stool.  Denies any recent hospitalizations or visits to the emergency department.  ROS: 10 point review of systems has been reviewed and considered negative except ones been listed in the HPI  Studies Reviewed EKG Interpretation Date/Time:  Monday May 27 2024 10:12:54 EDT Ventricular Rate:  132 PR Interval:    QRS Duration:  162 QT Interval:  312 QTC Calculation: 462 R Axis:   -26  Text Interpretation: Atrial flutter with variable A-V block with premature ventricular or aberrantly conducted complexes Non-specific intra-ventricular conduction block When compared with ECG of 24-Feb-2022 13:21, Atrial flutter has replaced Sinus rhythm QRS duration has increased Confirmed by Gerard Frederick ([id number]) on 05/27/2024 10:17:04 AM    2D echo 02/25/2022 1. Left ventricular ejection fraction, by estimation, is 60 to 65%. The  left ventricle has normal function. The left ventricle has no regional  wall motion abnormalities. Left ventricular diastolic parameters are  consistent with Grade I diastolic  dysfunction (impaired relaxation).   2. Right ventricular systolic function is moderately reduced. The right  ventricular size is severely enlarged. There is  normal pulmonary artery  systolic pressure. The estimated right ventricular systolic pressure is  34.6 mmHg.   3. Right atrial size was moderately dilated.   4. The mitral valve is normal in structure. Mild mitral valve  regurgitation.   5. The aortic valve is tricuspid. Aortic valve regurgitation is not  visualized. Aortic valve sclerosis is present, with no evidence of aortic  valve stenosis.   6. The inferior vena cava is normal in size with greater than 50%  respiratory variability, suggesting right atrial pressure of 3 mmHg.   S/p TEE/DCCV 12/25/2019:  EF 40%, LAA without thrombus, bubble study negative for shunting, no significant valvular abnormalities. Underwent successful DCCV with single shock of 120J with conversion to NSR. Recurrent Afib next day on 12/26/19.  Nuclear stress test 12/25/2019:  Small fixed apical defect likely artifact. No ischemia. EF 40% with globally  s/p successful DCCV 02/28/20.  Recurrent Afib 03/13/20. Digoxin increased to 250mcg po daily  05/05/20: s/p EP study and cryoballoon ablation CTI/PVI,  additional ablation in left atrial roof line, proximal and mid coronary sinus, appendage side between veins by Dr. Perri.   2D echo 10/16/2018 Study Conclusions  - Left ventricle: The cavity size was normal. Wall thickness was    normal. Systolic function was normal. The estimated ejection    fraction was in the range of 55% to 65%.  - Mitral valve: There was mild regurgitation.  Risk Assessment/Calculations  CHA2DS2-VASc Score = 4   This indicates a 4.8% annual risk of stroke. The patient's score is based upon: CHF History: 1 HTN History: 1 Diabetes History: 1 Stroke History: 0 Vascular Disease History: 0 Age Score: 1 Gender Score: 0            Physical Exam VS:  BP 123/86 (BP Location: Left Arm, Patient Position: Sitting, Cuff Size: Large)   Pulse (!) 132   Ht 5' 7 (1.702 m)   Wt 263 lb 3.2 oz (119.4 kg)   SpO2 98%   BMI 41.22 kg/m         Wt Readings from Last 3 Encounters:  05/27/24 263 lb 3.2 oz (119.4 kg)  10/12/22 226 lb (102.5 kg)  04/13/22 227 lb 6.4 oz (103.1 kg)    GEN: Well nourished, well developed in no acute distress NECK: No JVD; No carotid bruits CARDIAC: IR IR, no murmurs, rubs, gallops RESPIRATORY:  Clear to auscultation without rales, wheezing or rhonchi  ABDOMEN: Soft, non-tender, obese, non-distended EXTREMITIES:  1+ edema BLE; No deformity, multiple scabbed areas from wounds on his lower extremities  ASSESSMENT AND PLAN New onset atrial flutter with a history paroxysmal atrial fibrillation status post ablation in 2021 and multiple DCCV's.  States that he has been having elevated heart rates for the last 1 to 2 months and his PCP placed him on a ZIO monitor which she has on today.  EKG today reveals atrial flutter with varying A-V block with a rate of 132.  He states that he has not missed any of his rivaroxaban  and takes 20 mg daily.  Discussed direct-current cardioversion procedure and patient is agreeable.  Monitor for will need to be placed on hold for the week of his procedure.  He has been sent for labs today of CBC, BMP, mag, and TSH.  He is also being scheduled for an updated echocardiogram for the new onset atrial flutter.  If after he continues to remain in atrial flutter he will need referral back to EP for potential a flutter ablation.  He stated that his Toprol -XL was recently increased to 75 mg by his PCP but is being increased to 100 mg today with a new prescription sent to the pharmacy of patient's choice.  He is continued on rivaroxaban  20 mg daily for CHA2DS2-VASc score of at least 4 for stroke prophylaxis.  Of note previously patient had been maintained on amiodarone and digoxin prior to his last ablation procedure.  HFimpEF with last echocardiogram revealed an LVEF that improved from 40% to 60-65%, no RWMA, G1 DD.  He denies any recurrent shortness of breath.  Is currently on losartan 50 mg  daily, furosemide 20 mg as needed.  He has been scheduled for an updated echocardiogram.  Medication adjustments will be determined after follow-up study as his last study was completed in 2023.  He has been encouraged to continue to weigh self daily as he primarily does weekly now.  He has been advised for weight increase of 2 to 3 pounds from 1 morning to the next dose of the days he needs to take his furosemide and that he is at higher risk going back in to heart failure with being in atrial flutter with elevated heart rates.  Recurrent pulmonary embolism where he previously had a saddle PE and underwent thrombectomy and was followed by vascular.  He has continued on rivaroxaban  20 mg daily.  Primary hypertension with a blood pressure today of 123/86  and repeat 129/91.  He has continued on furosemide 20 mg as needed, losartan 50 mg daily, and Toprol -XL 100 mg daily.  He has been encouraged to continue to monitor his pressure 1 to 2 hours postmedication administration as well.  Mixed hyperlipidemia with associated type 2 diabetes which is continued on ezetimibe  10 mg daily.  We have requested his most recent labs from his PCP.  Ongoing management per PCP.  Morbid obesity with a BMI of 41.22.  He has been continued on Mounjaro .  Ongoing management of his medication by his PCP.  He has been encouraged to make dietary changes as well.  Bilateral lower extremity edema likely venous insufficiency where he has been encouraged to participate in conservative therapy of elevating his extremities, foot calf pumps, decreasing his sodium intake, and using his furosemide only as needed basis related to sliding scale weights.  He would benefit from compression stockings but he has healing wounds to bilateral lower extremities and would not recommend stockings at this time.  Continue with conservative therapy as described above.    Informed Consent   Shared Decision Making/Informed Consent The risks (stroke,  cardiac arrhythmias rarely resulting in the need for a temporary or permanent pacemaker, skin irritation or burns and complications associated with conscious sedation including aspiration, arrhythmia, respiratory failure and death), benefits (restoration of normal sinus rhythm) and alternatives of a direct current cardioversion were explained in detail to Mr. Sebastian and he agrees to proceed.       Dispo: Patient to return to clinic to see MD/APP in 2 to 3 weeks postprocedure or sooner if needed.  Signed, Crystalann Korf, NP

## 2024-05-28 ENCOUNTER — Ambulatory Visit: Payer: Self-pay | Admitting: Cardiology

## 2024-05-28 LAB — TSH: TSH: 3.96 u[IU]/mL (ref 0.450–4.500)

## 2024-05-28 LAB — MAGNESIUM: Magnesium: 1.8 mg/dL (ref 1.6–2.3)

## 2024-05-28 LAB — BASIC METABOLIC PANEL WITH GFR
BUN/Creatinine Ratio: 9 — ABNORMAL LOW (ref 10–24)
BUN: 10 mg/dL (ref 8–27)
CO2: 22 mmol/L (ref 20–29)
Calcium: 9.9 mg/dL (ref 8.6–10.2)
Chloride: 97 mmol/L (ref 96–106)
Creatinine, Ser: 1.09 mg/dL (ref 0.76–1.27)
Glucose: 89 mg/dL (ref 70–99)
Potassium: 4.8 mmol/L (ref 3.5–5.2)
Sodium: 141 mmol/L (ref 134–144)
eGFR: 73 mL/min/1.73 (ref >59)

## 2024-05-28 LAB — CBC
Hematocrit: 55.1 % — ABNORMAL HIGH (ref 37.5–51.0)
Hemoglobin: 18 g/dL — ABNORMAL HIGH (ref 13.0–17.7)
MCH: 32.9 pg (ref 26.6–33.0)
MCHC: 32.7 g/dL (ref 31.5–35.7)
MCV: 101 fL — ABNORMAL HIGH (ref 79–97)
Platelets: 250 x10E3/uL (ref 150–450)
RBC: 5.47 x10E6/uL (ref 4.14–5.80)
RDW: 12.3 % (ref 11.6–15.4)
WBC: 8.7 x10E3/uL (ref 3.4–10.8)

## 2024-05-28 NOTE — Progress Notes (Signed)
 Preprocedure lab prior to cardioversion remained stable.  No changes to current medication regimen needed at this time.

## 2024-06-05 ENCOUNTER — Ambulatory Visit: Admitting: Certified Registered"

## 2024-06-05 ENCOUNTER — Other Ambulatory Visit: Payer: Self-pay

## 2024-06-05 ENCOUNTER — Encounter: Payer: Self-pay | Admitting: Cardiovascular Disease

## 2024-06-05 ENCOUNTER — Encounter
Admission: RE | Disposition: A | Discharge status: Home / Self Care | Payer: Self-pay | Source: Home / Self Care | Attending: Cardiovascular Disease

## 2024-06-05 ENCOUNTER — Ambulatory Visit
Admission: RE | Admit: 2024-06-05 | Discharge: 2024-06-05 | Disposition: A | Discharge status: Home / Self Care | Attending: Cardiovascular Disease | Admitting: Cardiovascular Disease

## 2024-06-05 DIAGNOSIS — I110 Hypertensive heart disease with heart failure: Secondary | ICD-10-CM | POA: Insufficient documentation

## 2024-06-05 DIAGNOSIS — Z87891 Personal history of nicotine dependence: Secondary | ICD-10-CM | POA: Insufficient documentation

## 2024-06-05 DIAGNOSIS — I2699 Other pulmonary embolism without acute cor pulmonale: Secondary | ICD-10-CM | POA: Insufficient documentation

## 2024-06-05 DIAGNOSIS — Z8711 Personal history of peptic ulcer disease: Secondary | ICD-10-CM | POA: Diagnosis not present

## 2024-06-05 DIAGNOSIS — I480 Paroxysmal atrial fibrillation: Secondary | ICD-10-CM | POA: Diagnosis not present

## 2024-06-05 DIAGNOSIS — K219 Gastro-esophageal reflux disease without esophagitis: Secondary | ICD-10-CM | POA: Insufficient documentation

## 2024-06-05 DIAGNOSIS — Z86711 Personal history of pulmonary embolism: Secondary | ICD-10-CM | POA: Diagnosis not present

## 2024-06-05 DIAGNOSIS — E119 Type 2 diabetes mellitus without complications: Secondary | ICD-10-CM | POA: Diagnosis not present

## 2024-06-05 DIAGNOSIS — Z9889 Other specified postprocedural states: Secondary | ICD-10-CM | POA: Insufficient documentation

## 2024-06-05 DIAGNOSIS — I484 Atypical atrial flutter: Secondary | ICD-10-CM

## 2024-06-05 DIAGNOSIS — J449 Chronic obstructive pulmonary disease, unspecified: Secondary | ICD-10-CM | POA: Diagnosis not present

## 2024-06-05 DIAGNOSIS — Z79899 Other long term (current) drug therapy: Secondary | ICD-10-CM | POA: Insufficient documentation

## 2024-06-05 DIAGNOSIS — Z7985 Long-term (current) use of injectable non-insulin antidiabetic drugs: Secondary | ICD-10-CM | POA: Diagnosis not present

## 2024-06-05 DIAGNOSIS — Z7901 Long term (current) use of anticoagulants: Secondary | ICD-10-CM | POA: Insufficient documentation

## 2024-06-05 DIAGNOSIS — I491 Atrial premature depolarization: Secondary | ICD-10-CM | POA: Diagnosis not present

## 2024-06-05 DIAGNOSIS — R0602 Shortness of breath: Secondary | ICD-10-CM

## 2024-06-05 DIAGNOSIS — I5022 Chronic systolic (congestive) heart failure: Secondary | ICD-10-CM | POA: Diagnosis not present

## 2024-06-05 DIAGNOSIS — I4891 Unspecified atrial fibrillation: Secondary | ICD-10-CM | POA: Diagnosis present

## 2024-06-05 DIAGNOSIS — E782 Mixed hyperlipidemia: Secondary | ICD-10-CM | POA: Diagnosis not present

## 2024-06-05 DIAGNOSIS — Z6841 Body Mass Index (BMI) 40.0 and over, adult: Secondary | ICD-10-CM | POA: Diagnosis not present

## 2024-06-05 DIAGNOSIS — I4892 Unspecified atrial flutter: Secondary | ICD-10-CM | POA: Diagnosis not present

## 2024-06-05 HISTORY — PX: CARDIOVERSION: SHX1299

## 2024-06-05 SURGERY — CARDIOVERSION
Anesthesia: General

## 2024-06-05 MED ORDER — PROPOFOL 10 MG/ML IV BOLUS
INTRAVENOUS | Status: DC | PRN
  Administered 2024-06-05: 20 mg via INTRAVENOUS
  Administered 2024-06-05: 70 mg via INTRAVENOUS
  Administered 2024-06-05: 20 mg via INTRAVENOUS

## 2024-06-05 MED ORDER — SODIUM CHLORIDE 0.9 % IV SOLN: INTRAVENOUS | Status: DC

## 2024-06-05 MED ORDER — LIDOCAINE HCL (CARDIAC) PF 100 MG/5ML IV SOSY
PREFILLED_SYRINGE | INTRAVENOUS | Status: DC | PRN
  Administered 2024-06-05: 50 mg via INTRATRACHEAL

## 2024-06-05 NOTE — Anesthesia Preprocedure Evaluation (Signed)
 Anesthesia Evaluation  Patient identified by MRN, date of birth, ID band Patient awake    Reviewed: Allergy & Precautions, H&P , NPO status , Patient's Chart, lab work & pertinent test results  History of Anesthesia Complications Negative for: history of anesthetic complications  Airway Mallampati: I   Neck ROM: full    Dental   Missing upper teeth x2:   Pulmonary neg shortness of breath, COPD, neg recent URI, former smoker   Pulmonary exam normal breath sounds clear to auscultation       Cardiovascular Exercise Tolerance: Good hypertension, (-) angina +CHF (EF 40%)  (-) Past MI and (-) Cardiac Stents negative cardio ROS + dysrhythmias (a fib s/p ablation) Atrial Fibrillation (-) Valvular Problems/Murmurs Rhythm:Regular Rate:Normal  Hx PE  EKG 05/13/21: normal sinus rhythm with frequent PVCs, PACs, 98 bpm, normal QRS, QTC 459 ms, no significant ST-T wave changes.  TEE 12/25/19: normal atrial size and no thrombus. LV EF 40% with global hypokinesis.  Nuclear stress test 12/25/19: Small fixed apical defect likely artifact. Noischemia. EF 40% with globally.   Neuro/Psych Seizures - (x1, medication-related),  PSYCHIATRIC DISORDERS  Depression       GI/Hepatic PUD,GERD (s/p Nissen)  ,,  Endo/Other  diabetes  Obesity   Renal/GU negative Renal ROS  negative genitourinary   Musculoskeletal   Abdominal   Peds  Hematology negative hematology ROS (+)   Anesthesia Other Findings Cardiology note 05/13/21:  Preoperative cardiovascular risk assessment:  Patient plans to undergo colonoscopy soon. He has history of paroxysmal atrial fibrillation s/p successful ablation on 04/2020, mild systolic dysfunction-felt tachycardia mediated, and uncontrolled hypertension. He was maintaining normal sinus rhythm during my recent evaluation as detailed below. Has remained fairly stable from cardiac standpoint however blood pressure was  significantly elevated prompting escalation of antihypertensive regimen. He will be at low risk for MACE undergoing low risk procedure at this time.  -He was recommended to continue Xarelto  20 mg p.o. daily long-term given unprovoked PE however he was not able to afford it. In the meantime, he was started on aspirin  81 mg p.o. daily. -He can hold aspirin  5 to 7 days prior to the upcoming procedure. Restart soon after as appropriate. -Continue current beta-blocker therapy, other hypertensive regimen perioperatively. Prefer to keep blood pressure less than 130/80 mmHg.  Patient PCP: Norleen CHRISTELLA [place], MD  Assessment and Plan Recommendations:  Patient with uncontrolled hypertension. Home blood pressure averages around 145/100s. Has occasional low blood pressure in 100-120 systolic with associated symptoms of lightheadedness. No syncope.  -Orthostatic vital signs negative. Has appropriate blood pressure response with position change. -Increase losartan 50 mg p.o. twice daily, metoprolol  succinate 25 mg p.o. twice daily for better blood pressure control. -Monitor blood pressure regularly and notify us  if it remains elevated. -Continue to follow 2 g sodium restricted diet, increase activity/exercise.  Has paroxysmal atrial fibrillation s/p successful ablation 05/05/2020. No recurrent PAF since then. -EKG today reveals normal sinus rhythm with frequent PACs and PVCs. -Continue metoprolol  succinate as above for backup rate control as well as ectopy suppression. -He discontinued Xarelto  about a month ago given high cost. Long-term Xarelto  was recommended given unprovoked PE. -Agrees to start aspirin  81 mg p.o. daily in the meantime.  Has mild systolic dysfunction with EF 40% per echo on 12/25/2019, felt to be tachycardia mediated. -He denies any overt heart failure symptoms. Weight somewhat stable. -Continue losartan, metoprolol  as above. -Has not required any as needed Lasix recently. -Continue dietary  restriction as above.  -Continue  ezetimibe  at current dose to keep goal LDL less than 100 minute/dL, preferably less than 70 mg/dL. -Follow heart healthy diet, regular exercise to help lose weight. -Reportedly had blood work done about a month ago at PCPs office and was told everything looked fine. We will request records.  Patient is obese but denies any snoring. Continue working on diet and exercise to help lose weight.  Has been battling with depression for last 6 to 7 years. In the depression changed to Cymbalta  and Abilify  recently. Plans to start counseling therapy soon.  Follow Up In 3-4 months, or sooner if needed.   Reproductive/Obstetrics negative OB ROS                              Anesthesia Physical Anesthesia Plan  ASA: 3  Anesthesia Plan: General   Post-op Pain Management:    Induction: Intravenous  PONV Risk Score and Plan: 2 and Propofol  infusion, Treatment may vary due to age or medical condition and TIVA  Airway Management Planned: Natural Airway and Nasal Cannula  Additional Equipment:   Intra-op Plan:   Post-operative Plan:   Informed Consent: I have reviewed the patients History and Physical, chart, labs and discussed the procedure including the risks, benefits and alternatives for the proposed anesthesia with the patient or authorized representative who has indicated his/her understanding and acceptance.       Plan Discussed with: CRNA  Anesthesia Plan Comments:          Anesthesia Quick Evaluation

## 2024-06-05 NOTE — Anesthesia Postprocedure Evaluation (Signed)
 Anesthesia Post Note  Patient: Dwayne James  Procedure(s) Performed: CARDIOVERSION  Patient location during evaluation: Specials Recovery Anesthesia Type: General Level of consciousness: awake and alert Pain management: pain level controlled Vital Signs Assessment: post-procedure vital signs reviewed and stable Respiratory status: spontaneous breathing, nonlabored ventilation, respiratory function stable and patient connected to nasal cannula oxygen Cardiovascular status: blood pressure returned to baseline and stable Postop Assessment: no apparent nausea or vomiting Anesthetic complications: no   No notable events documented.   Last Vitals:  Vitals:   06/05/24 0830 06/05/24 0845  BP: 115/82 128/76  Pulse: (P) 92 92  Resp: 14 17  Temp:    SpO2: 98% 99%    Last Pain:  Vitals:   06/05/24 0820  TempSrc: Temporal  PainSc: 0-No pain                 Prentice Murphy

## 2024-06-05 NOTE — Transfer of Care (Signed)
 Immediate Anesthesia Transfer of Care Note  Patient: Dwayne James  Procedure(s) Performed: CARDIOVERSION  Patient Location: PACU  Anesthesia Type:General  Level of Consciousness: drowsy and patient cooperative  Airway & Oxygen Therapy: Patient Spontanous Breathing and Patient connected to nasal cannula oxygen  Post-op Assessment: Report given to RN, Post -op Vital signs reviewed and stable, and Patient moving all extremities X 4  Post vital signs: Reviewed and stable  Last Vitals:  Vitals Value Taken Time  BP 115/79 06/05/24 08:17  Temp    Pulse 88 06/05/24 08:18  Resp 16 06/05/24 08:18  SpO2 98 % 06/05/24 08:18    Last Pain:  Vitals:   06/05/24 0732  TempSrc: Temporal  PainSc: 0-No pain         Complications: No notable events documented.

## 2024-06-05 NOTE — CV Procedure (Signed)
Cardioversion procedure note For atrial flutter, typical  Procedure Details:  Consent: Risks of procedure as well as the alternatives and risks of each were explained to the (patient/caregiver). Consent for procedure obtained.  Time Out: Verified patient identification, verified procedure, site/side was marked, verified correct patient position, special equipment/implants available, medications/allergies/relevent history reviewed, required imaging and test results available. Performed  Patient placed on cardiac monitor, pulse oximetry, supplemental oxygen as necessary.  Sedation given: propofol IV, Dr. Rosey Bath Pacer pads placed anterior and posterior chest.   Cardioverted 1 time(s).  Cardioverted at  150 J. Synchronized biphasic Converted to NSR   Evaluation: Findings: Post procedure EKG shows: NSR Complications: None Patient did tolerate procedure well.  Time Spent Directly with the Patient:  52 minutes   Esmond Plants, M.D., Ph.D.

## 2024-06-06 ENCOUNTER — Encounter: Payer: Self-pay | Admitting: Cardiovascular Disease

## 2024-06-06 NOTE — H&P (Signed)

## 2024-06-06 NOTE — Interval H&P Note (Signed)
 History and Physical Interval Note:  06/06/2024 5:29 PM  Dwayne James  has presented today for surgery, with the diagnosis of Cardioversion   Afib OK 2nd case  [facility]  Anesthesia.  The various methods of treatment have been discussed with the patient and family. After consideration of risks, benefits and other options for treatment, the patient has consented to  Procedure(s): CARDIOVERSION (N/A) as a surgical intervention.  The patient's history has been reviewed, patient examined, no change in status, stable for surgery.  I have reviewed the patient's chart and labs.  Questions were answered to the patient's satisfaction.     Charda Janis

## 2024-07-12 ENCOUNTER — Ambulatory Visit: Attending: Cardiology

## 2024-07-12 DIAGNOSIS — I484 Atypical atrial flutter: Secondary | ICD-10-CM | POA: Diagnosis present

## 2024-07-12 LAB — ECHOCARDIOGRAM COMPLETE
AR max vel: 2.65 cm2
AV Area VTI: 2.51 cm2
AV Area mean vel: 2.55 cm2
AV Mean grad: 4 mmHg
AV Peak grad: 7.5 mmHg
Ao pk vel: 1.37 m/s
Area-P 1/2: 3.46 cm2
S' Lateral: 3.6 cm

## 2024-07-16 ENCOUNTER — Ambulatory Visit: Attending: Cardiology | Admitting: Cardiology

## 2024-07-16 ENCOUNTER — Encounter: Payer: Self-pay | Admitting: Cardiology

## 2024-07-16 VITALS — BP 120/70 | HR 107 | Ht 68.0 in | Wt 259.0 lb

## 2024-07-16 DIAGNOSIS — E782 Mixed hyperlipidemia: Secondary | ICD-10-CM | POA: Insufficient documentation

## 2024-07-16 DIAGNOSIS — I484 Atypical atrial flutter: Secondary | ICD-10-CM | POA: Diagnosis present

## 2024-07-16 DIAGNOSIS — I10 Essential (primary) hypertension: Secondary | ICD-10-CM | POA: Diagnosis present

## 2024-07-16 DIAGNOSIS — E1169 Type 2 diabetes mellitus with other specified complication: Secondary | ICD-10-CM | POA: Insufficient documentation

## 2024-07-16 DIAGNOSIS — R600 Localized edema: Secondary | ICD-10-CM | POA: Insufficient documentation

## 2024-07-16 DIAGNOSIS — I5032 Chronic diastolic (congestive) heart failure: Secondary | ICD-10-CM | POA: Insufficient documentation

## 2024-07-16 DIAGNOSIS — I480 Paroxysmal atrial fibrillation: Secondary | ICD-10-CM | POA: Diagnosis not present

## 2024-07-16 DIAGNOSIS — Z86711 Personal history of pulmonary embolism: Secondary | ICD-10-CM | POA: Diagnosis present

## 2024-07-16 MED ORDER — METOPROLOL SUCCINATE ER 50 MG PO TB24: 50.0000 mg | ORAL_TABLET | Freq: Every day | ORAL | 3 refills | Status: DC

## 2024-07-16 NOTE — Patient Instructions (Signed)
 Ambulatory referral to Wound Clinic  Medication Instructions:  Your physician recommends the following medication changes.  INCREASE: Metoprolol  to 150 mg daily   *If you need a refill on your cardiac medications before your next appointment, please call your pharmacy*  Lab Work: No labs ordered today  If you have labs (blood work) drawn today and your tests are completely normal, you will receive your results only by: MyChart Message (if you have MyChart) OR A paper copy in the mail If you have any lab test that is abnormal or we need to change your treatment, we will call you to review the results.  Testing/Procedures: No test ordered today   Follow-Up: At [facility], you and your health needs are our priority.  As part of our continuing mission to provide you with exceptional heart care, our providers are all part of one team.  This team includes your primary Cardiologist (physician) and Advanced Practice Providers or APPs (Physician Assistants and Nurse Practitioners) who all work together to provide you with the care you need, when you need it.  Your next appointment:   6 week(s)  Provider:   You may see one of the following Advanced Practice Providers on your designated Care Team:   Lonni Meager, NP Lesley Maffucci, PA-C Bernardino Bring, PA-C Cadence Waterville, PA-C Tylene Lunch, NP Barnie Hila, NP    We recommend signing up for the patient portal called MyChart.  Sign up information is provided on this After Visit Summary.  MyChart is used to connect with patients for Virtual Visits (Telemedicine).  Patients are able to view lab/test results, encounter notes, upcoming appointments, etc.  Non-urgent messages can be sent to your provider as well.   To learn more about what you can do with MyChart, go to [email or web address].

## 2024-07-16 NOTE — Progress Notes (Signed)
 Cardiology Office Note   Date:  07/16/2024  ID:  Dwayne James, DOB 01-11-1956, MRN [id number] PCP: Harriette PONCE Norleen LELON, MD  [facility] Providers Cardiologist:  None Cardiology APP:  Gerard Frederick, NP     History of Present Illness Dwayne James is a 68 y.o. 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   Previously had been followed by Dr Charlanne at [facility].  He had previously been found to be in persistent atrial fibrillation with intermittent RVR where he was extremely symptomatic in 2021.  He underwent TEE/DCCV 12/25/2019 with an LVEF of 40%, LAA without thrombus, bubble study negative for shunting, no significant valvular abnormalities.  Underwent successful DCCV with single shock 120 J with conversion to normal sinus rhythm.  Unfortunately recurrent atrial fibrillation the next day.  He underwent nuclear stress testing in 12/2019 which revealed small fixed apical defect likely artifact.  No ischemia, EF 40% with global hypokinesis.  He was noted to have a CHA2DS2-VASc score of 5 and had previously been on apixaban  but discontinued apixaban  on his own due to co-pay.  In April 2021 he underwent successful DCCV with recurrent atrial fibrillation noted 03/13/2024.  His digoxin was increased to 250 mcg daily.  He continued to remain asymptomatic with persistent atrial fibrillation and intermittent episodes of RVR.  He was loaded on amiodarone and was referred to EP for ablation procedure.  In July 2021 he was status post EP study with a cryoballoon ablation CTI/PVI, additional ablation in the left atrial roofline, proximal to mid coronary  sinus, appendage side between veins and by Dr. Perri.  There was no reoccurrence of atrial fibrillation after his ablation procedure.  History noted of PE 09/2018 requiring suction removal.  He was noncompliant with anticoagulation (had declined warfarin and was only taken aspirin  therapy) and had recurrent saddle PE in April 2023 while off of [facility] and only amendable to aspirin  therapy.  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  Hepatic continued on rivaroxaban  since that time.  Repeat echocardiogram revealed an LVEF of 60-65%, no RWMA, G1 DD, enlarged RV with moderately reduced RV systolic function, moderately dilated RA, mild MR, and normal inferior vena cava size.  He was last evaluated by cardiology 06/16/2023 stating he was back to his usual state of health.  He had stopped taking Ozempic since he did not see benefit.  His weight had increased back from 12/05/2018 2-50.  Reported depression symptoms were well-controlled with current medications and routine therapy.  Remains stable functionally.  Continues to deny any recurrent exertional chest discomfort, shortness of breath, palpitations, nausea/vomiting, lightheadedness, dizziness, or syncope.  Had moderate persistent lower extremity edema for few months.  Had not taken Lasix in quite some time.  Denies any significant OSA symptoms.  Continued to drink alcohol daily and also stated he was compliant with oral anticoagulant therapy.  He was encouraged to minimize his alcohol intake.  Continued on metoprolol  succinate 50 mg daily, rivaroxaban  20 mg daily, and scheduled for routine labs.  There were no other procedures that were ordered at that time.  He was also encouraged to take Lasix 20 mg  daily for 3 days and then as needed.  Results of his BNP and BNP returned and he was advised to participate in conservative therapy that  chronic diuretic therapy was not indicated.   He was last seen in clinic 05/27/2024 with elevated heart rates that been ongoing for the last 1 to 2 months.  He stated that he applied his blood pressure and heart rate to his PCPs office and they were concerned about his elevated heart rates.  He was mailed a ZIO monitor which he was wearing during his visit.  His metoprolol  was increased to 75 mg daily.  EKG revealed that he was in atrial flutter with variable AV block with a rate of 132.  He was sent for updated labs of CBC, BMP, mag, and TSH.  He was also scheduled for an updated echocardiogram for new onset atrial flutter.  He was scheduled for a cardioversion and if he continued to have recurrence of atrial flutter discussed referral back to EP for potential flutter ablation.  He underwent his cardioversion procedure on 06/05/2024.  He was converted in 150 J and converted to normal sinus rhythm.  He tolerated the procedure well and there were no immediate postoperative complications.  He returns to clinic today stating overall he has been doing well from a cardiac perspective.  He states that his resting heart rates at home he had noticed to be in the 80s and 90s.  He continues to be compliant with his current medication regimen with any undue side effects.  Has not missed any doses of his rivaroxaban .  Denies any bleeding with no blood noted in his urine or stool.  Unfortunately he does have an area to his left lower extremity that he has knocked the scab off and is bleeding today.  ROS: 10 point review of systems has been reviewed and considered negative except ones been listed in HPI  Studies Reviewed EKG Interpretation Date/Time:  Tuesday July 16 2024 11:29:38 EDT Ventricular Rate:  107 PR Interval:  156 QRS Duration:  84 QT Interval:  338 QTC Calculation: 451 R Axis:   -27  Text Interpretation: Sinus tachycardia Low voltage QRS When compared with ECG of 05-Jun-2024 08:14, PREVIOUS ECG IS  PRESENT Confirmed by Gerard Frederick ([id number]) on 07/16/2024 1:06:17 PM    2d echo 07/12/2024 1. Left ventricular ejection fraction, by estimation, is 60 to 65%. The  left ventricle has normal function. The left ventricle has no regional  wall motion abnormalities. Left ventricular diastolic parameters are  consistent with Grade I diastolic  dysfunction (impaired relaxation).   2. Right ventricular systolic function is normal. The right ventricular  size is normal.   3. The mitral valve is normal in structure. No evidence of mitral valve  regurgitation.   4. The aortic valve is tricuspid. Aortic valve regurgitation is not  visualized. Aortic valve sclerosis is present, with no evidence of aortic  valve stenosis.   5. The inferior vena cava is normal in size with greater than 50%  respiratory variability, suggesting right atrial pressure of 3 mmHg.   2D echo 02/25/2022 1. Left ventricular ejection fraction, by estimation, is 60 to 65%. The  left ventricle has normal function. The left ventricle has no regional  wall motion abnormalities. Left ventricular diastolic parameters are  consistent with Grade I diastolic  dysfunction (impaired relaxation).   2. Right ventricular systolic function is moderately reduced. The right  ventricular size is severely enlarged. There is normal pulmonary artery  systolic  pressure. The estimated right ventricular systolic pressure is  34.6 mmHg.   3. Right atrial size was moderately dilated.   4. The mitral valve is normal in structure. Mild mitral valve  regurgitation.   5. The aortic valve is tricuspid. Aortic valve regurgitation is not  visualized. Aortic valve sclerosis is present, with no evidence of aortic  valve stenosis.   6. The inferior vena cava is normal in size with greater than 50%  respiratory variability, suggesting right atrial pressure of 3 mmHg.    S/p TEE/DCCV 12/25/2019:  EF 40%, LAA without thrombus, bubble study negative for  shunting, no significant valvular abnormalities. Underwent successful DCCV with single shock of 120J with conversion to NSR. Recurrent Afib next day on 12/26/19.  Nuclear stress test 12/25/2019:  Small fixed apical defect likely artifact. No ischemia. EF 40% with globally   s/p successful DCCV 02/28/20.  Recurrent Afib 03/13/20. Digoxin increased to 250mcg po daily  05/05/20: s/p EP study and cryoballoon ablation CTI/PVI,  additional ablation in left atrial roof line, proximal and mid coronary sinus, appendage side between veins by Dr. Perri.    2D echo 10/16/2018 Study Conclusions  - Left ventricle: The cavity size was normal. Wall thickness was    normal. Systolic function was normal. The estimated ejection    fraction was in the range of 55% to 65%.  - Mitral valve: There was mild regurgitation.   Risk Assessment/Calculations  CHA2DS2-VASc Score = 4   This indicates a 4.8% annual risk of stroke. The patient's score is based upon: CHF History: 1 HTN History: 1 Diabetes History: 1 Stroke History: 0 Vascular Disease History: 0 Age Score: 1 Gender Score: 0            Physical Exam VS:  BP 120/70 (BP Location: Left Arm, Patient Position: Sitting, Cuff Size: Large)   Pulse (!) 107   Ht 5' 8 (1.727 m)   Wt 259 lb (117.5 kg)   SpO2 98%   BMI 39.38 kg/m        Wt Readings from Last 3 Encounters:  07/16/24 259 lb (117.5 kg)  06/05/24 265 lb (120.2 kg)  05/27/24 263 lb 3.2 oz (119.4 kg)    GEN: Well nourished, well developed in no acute distress NECK: No JVD; No carotid bruits CARDIAC: RRR, tachycardic, no murmurs, rubs, gallops RESPIRATORY:  Clear to auscultation without rales, wheezing or rhonchi  ABDOMEN: Soft, obese, non-tender, non-distended EXTREMITIES:  1+ edema; No deformity , scabbed area send this colorization likely from longstanding venous insufficiency in 1 area that has been bleeding.  ASSESSMENT AND PLAN Recent new onset atrial flutter status post DCCV with  history of paroxysmal atrial fibrillation status post ablation in 2021 multiple DCCV's.  Recently undergone DCCV for new onset atrial flutter and is now noted to be sinus tachycardia with varying rate of 80-107.  He states that his heart rate is up as he had to walk from the parking lot today.  He has been continued on rivaroxaban  20 mg daily for CHA2DS2-VASc score of at least 4 for stroke prophylaxis.  His metoprolol  has been increased to 100 mg daily 250 mg daily.  He he is requesting Toprol -XL 50 mg to be sent in to continue 150 without having to cut pills in half.    HFimpEF with last echocardiogram revealed an LVEF that improved from 40% to 60 to 65% no RWMA, G1 DD.  Denies any recurrent shortness of breath.  Has been continued on Toprol -XL.  He also has furosemide 20 mg as needed.  He has also continued on Mounjaro .  Recurrent pulmonary embolism where he previously had a saddle PE and underwent thrombectomy and continues to be followed by vascular.  He has been maintained on rivaroxaban  20 mg daily indefinitely.  Primary hypertension with a blood pressure of 120/70.  Blood pressures remain stable.  He is continued on his current medication regimen.  He is also been encouraged to continue to monitor pressures 1 to 2 hours postmedication administration at home as well.  Mixed hyperlipidemia with associated type 2 diabetes versus continued on ezetimibe  10 mg daily.  Ongoing management of his hyperlipidemia and diabetes by his PCP.  Morbid obesity with a BMI of 39.38.  He has been continued on Mounjaro .  Ongoing management by his PCP.  He has also been encouraged to continue with lifestyle and dietary modifications as well.  Bilateral lower extremity edema likely venous insufficiency versus been encouraged to continue to participate in conservative therapy.  Unfortunately he is unable to wear compression stockings due to open areas in his bilateral lower extremity 1 area of bleeding today.  He has not  been referred to the wound clinic for wraps/Unna boots to help heal some of the areas he has to his bilateral lower extremities.       Dispo: Patient to return to clinic to see MD/APP in 6 weeks or sooner if needed for further evaluation.  Signed, Josey Forcier, NP

## 2024-08-20 ENCOUNTER — Encounter: Attending: Physician Assistant | Admitting: Physician Assistant

## 2024-08-20 DIAGNOSIS — I110 Hypertensive heart disease with heart failure: Secondary | ICD-10-CM | POA: Insufficient documentation

## 2024-08-20 DIAGNOSIS — I890 Lymphedema, not elsewhere classified: Secondary | ICD-10-CM | POA: Diagnosis not present

## 2024-08-20 DIAGNOSIS — Z86718 Personal history of other venous thrombosis and embolism: Secondary | ICD-10-CM | POA: Diagnosis not present

## 2024-08-20 DIAGNOSIS — I87332 Chronic venous hypertension (idiopathic) with ulcer and inflammation of left lower extremity: Secondary | ICD-10-CM | POA: Insufficient documentation

## 2024-08-20 DIAGNOSIS — I5042 Chronic combined systolic (congestive) and diastolic (congestive) heart failure: Secondary | ICD-10-CM | POA: Insufficient documentation

## 2024-08-20 DIAGNOSIS — L03116 Cellulitis of left lower limb: Secondary | ICD-10-CM | POA: Diagnosis not present

## 2024-08-20 DIAGNOSIS — L97822 Non-pressure chronic ulcer of other part of left lower leg with fat layer exposed: Secondary | ICD-10-CM | POA: Diagnosis not present

## 2024-08-20 DIAGNOSIS — I480 Paroxysmal atrial fibrillation: Secondary | ICD-10-CM | POA: Diagnosis not present

## 2024-08-20 DIAGNOSIS — E11622 Type 2 diabetes mellitus with other skin ulcer: Secondary | ICD-10-CM | POA: Diagnosis present

## 2024-08-27 ENCOUNTER — Encounter: Payer: Self-pay | Admitting: Cardiology

## 2024-08-27 ENCOUNTER — Ambulatory Visit: Attending: Cardiology | Admitting: Cardiology

## 2024-08-27 ENCOUNTER — Encounter: Admitting: Physician Assistant

## 2024-08-27 VITALS — BP 112/78 | HR 95 | Ht 68.0 in | Wt 258.4 lb

## 2024-08-27 DIAGNOSIS — I484 Atypical atrial flutter: Secondary | ICD-10-CM | POA: Insufficient documentation

## 2024-08-27 DIAGNOSIS — R600 Localized edema: Secondary | ICD-10-CM | POA: Insufficient documentation

## 2024-08-27 DIAGNOSIS — I480 Paroxysmal atrial fibrillation: Secondary | ICD-10-CM | POA: Diagnosis not present

## 2024-08-27 DIAGNOSIS — I10 Essential (primary) hypertension: Secondary | ICD-10-CM | POA: Diagnosis present

## 2024-08-27 DIAGNOSIS — I5020 Unspecified systolic (congestive) heart failure: Secondary | ICD-10-CM | POA: Insufficient documentation

## 2024-08-27 DIAGNOSIS — Z86711 Personal history of pulmonary embolism: Secondary | ICD-10-CM | POA: Insufficient documentation

## 2024-08-27 DIAGNOSIS — E11622 Type 2 diabetes mellitus with other skin ulcer: Secondary | ICD-10-CM | POA: Diagnosis not present

## 2024-08-27 MED ORDER — METOPROLOL SUCCINATE ER 200 MG PO TB24: 200.0000 mg | ORAL_TABLET | Freq: Every day | ORAL | 3 refills | Status: AC

## 2024-08-27 NOTE — Patient Instructions (Signed)
 Medication Instructions:  Your physician recommends the following medication changes.  INCREASE: Toprol  XL to 200 mg daily  *If you need a refill on your cardiac medications before your next appointment, please call your pharmacy*  Lab Work: No labs ordered today  If you have labs (blood work) drawn today and your tests are completely normal, you will receive your results only by: MyChart Message (if you have MyChart) OR A paper copy in the mail If you have any lab test that is abnormal or we need to change your treatment, we will call you to review the results.  Testing/Procedures: No test ordered today   Follow-Up: At [facility], you and your health needs are our priority.  As part of our continuing mission to provide you with exceptional heart care, our providers are all part of one team.  This team includes your primary Cardiologist (physician) and Advanced Practice Providers or APPs (Physician Assistants and Nurse Practitioners) who all work together to provide you with the care you need, when you need it.  Your next appointment:   10 week(s)  Provider:   Tylene Lunch, NP   We recommend signing up for the patient portal called MyChart.  Sign up information is provided on this After Visit Summary.  MyChart is used to connect with patients for Virtual Visits (Telemedicine).  Patients are able to view lab/test results, encounter notes, upcoming appointments, etc.  Non-urgent messages can be sent to your provider as well.   To learn more about what you can do with MyChart, go to [email or web address].

## 2024-08-27 NOTE — Progress Notes (Signed)
 Cardiology Office Note   Date:  08/27/2024  ID:  Dwayne James, DOB Jan 13, 1956, MRN [id number] PCP: Harriette PONCE Norleen LELON, MD  [facility] Providers Cardiologist:  None Cardiology APP:  Gerard Frederick, NP     History of Present Illness Dwayne James is a 68 y.o. 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   Previously had been followed by Dr Charlanne at [facility].  He had previously been found to be in persistent atrial fibrillation with intermittent RVR where he was extremely symptomatic in 2021.  He underwent TEE/DCCV 12/25/2019 with an LVEF of 40%, LAA without thrombus, bubble study negative for shunting, no significant valvular abnormalities.  Underwent successful DCCV with single shock 120 J with conversion to normal sinus rhythm.  Unfortunately recurrent atrial fibrillation the next day.  He underwent nuclear stress testing in 12/2019 which revealed small fixed apical defect likely artifact.  No ischemia, EF 40% with global hypokinesis.  He was noted to have a CHA2DS2-VASc score of 5 and had previously been on apixaban  but discontinued apixaban  on his own due to co-pay.  In April 2021 he underwent successful DCCV with recurrent atrial fibrillation noted 03/13/2024.  His digoxin was increased to 250 mcg daily.  He continued to remain asymptomatic with persistent atrial fibrillation and intermittent episodes of RVR.  He was loaded on amiodarone and was referred to EP for ablation procedure.  In July 2021 he was status post EP study with a cryoballoon ablation CTI/PVI, additional ablation in the left atrial roofline, proximal to mid coronary sinus, appendage side between veins and by Dr.  Perri.  There was no reoccurrence of atrial fibrillation after his ablation procedure.  History noted of PE 09/2018 requiring suction removal.  He was noncompliant with anticoagulation (had declined warfarin and was only taken aspirin  therapy) and had recurrent saddle PE in April 2023 while off of OAC and only amendable to aspirin  therapy.  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  Hepatic continued on rivaroxaban  since that time.  Repeat echocardiogram revealed an LVEF of 60-65%, no RWMA, G1 DD, enlarged RV with moderately reduced RV systolic function, moderately dilated RA, mild MR, and normal inferior vena cava size.  He was last evaluated by cardiology 06/16/2023 stating he was back to his usual state of health.  He had stopped taking Ozempic since he did not see benefit.  His weight had increased back from 12/05/2018 2-50.  Reported depression symptoms were well-controlled with current medications and routine therapy.  Remains stable functionally.  Continues to deny any recurrent exertional chest discomfort, shortness of breath, palpitations, nausea/vomiting, lightheadedness, dizziness, or syncope.  Had moderate persistent lower extremity edema for few months.  Had not taken Lasix in quite some time.  Denies any significant OSA symptoms.  Continued to drink alcohol daily and also stated he was compliant with oral anticoagulant therapy.  He was encouraged to minimize his alcohol intake.  Continued on metoprolol  succinate 50 mg daily, rivaroxaban  20 mg daily, and scheduled for routine labs.  There were no other procedures that were ordered at that time.  He was also encouraged to take Lasix 20 mg daily for 3 days and then  as needed.  Results of his BNP and BNP returned and he was advised to participate in conservative therapy that chronic diuretic therapy was not  indicated.  He was previously seen in clinic 7/27 showed an elevated heart rate 7-ongoing for least 1 to 2 months.  He stated that he had been mailed a ZIO monitor which she was wearing during his visit.  His metoprolol  was increased to 75 mg daily.  EKG revealed that he was in atrial flutter with a varying AV block with a rate of 132.  He was sent for updated labs.  He was scheduled for an updated echocardiogram for new onset atrial flutter.  He was scheduled for cardioversion and discussed if he had recurrence of atrial flutter he would be referred back to EP for potential flutter ablation.  He underwent cardioversion procedure on 06/05/2024 where he converted after being shocked 250 J and converted to normal sinus rhythm.  He tolerated the procedure well and there were no immediate postoperative complications.   He was last seen in clinic 07/16/2024 and overall was doing well with cardiac perspective.  Resting heart rates at home.  In the 80s to 90s.  He had been compliant with his medications without any undue side effects.  Had not had any missed doses of rivaroxaban .  Denies any blood in his urine or stool.  He was maintaining sinus tachycardia with his metoprolol  increased from 100 mg to 150 mg daily.  He returns today stating that he has been doing well with a cardiac perspective.  He denies any chest pain or worsening shortness of breath.  States that his resting heart rate has slightly improved at home but he still noted to be in the 70s to 90s at home.  States that he has been compliant with his current medication regimen.  States that he has not missed any doses of his rivaroxaban .  Denies any bleeding in his stool or urine.  Has both of his lower extremities wrapped today.  Denies any recent hospitalizations or visits to the emergency department.  ROS: 10 point review of system has been reviewed and considered negative with exception was been listed in the HPI  Studies Reviewed EKG  Interpretation Date/Time:  Tuesday August 27 2024 10:23:34 EDT Ventricular Rate:  95 PR Interval:  144 QRS Duration:  84 QT Interval:  358 QTC Calculation: 449 R Axis:   -21  Text Interpretation: Sinus rhythm with Premature supraventricular complexes Low voltage QRS When compared with ECG of 16-Jul-2024 11:29, Premature supraventricular complexes are now Present Confirmed by Gerard Frederick ([id number]) on 08/27/2024 10:47:56 AM    2d echo 07/12/2024 1. Left ventricular ejection fraction, by estimation, is 60 to 65%. The  left ventricle has normal function. The left ventricle has no regional  wall motion abnormalities. Left ventricular diastolic parameters are  consistent with Grade I diastolic  dysfunction (impaired relaxation).   2. Right ventricular systolic function is normal. The right ventricular  size is normal.   3. The mitral valve is normal in structure. No evidence of mitral valve  regurgitation.   4. The aortic valve is tricuspid. Aortic valve regurgitation is not  visualized. Aortic valve sclerosis is present, with no evidence of aortic  valve stenosis.   5. The inferior vena cava is normal in size with greater than 50%  respiratory variability, suggesting right atrial pressure of 3 mmHg.    2D echo 02/25/2022 1. Left ventricular ejection fraction, by estimation, is 60 to 65%. The  left ventricle has normal function. The left ventricle has no regional  wall motion abnormalities. Left ventricular diastolic parameters are  consistent with Grade I diastolic  dysfunction (impaired relaxation).   2. Right ventricular systolic function is moderately reduced. The right  ventricular size is severely enlarged. There is normal pulmonary artery  systolic pressure. The estimated right ventricular systolic pressure is  34.6 mmHg.   3. Right atrial size was moderately dilated.   4. The mitral valve is normal in structure. Mild mitral valve  regurgitation.   5. The aortic valve is  tricuspid. Aortic valve regurgitation is not  visualized. Aortic valve sclerosis is present, with no evidence of aortic  valve stenosis.   6. The inferior vena cava is normal in size with greater than 50%  respiratory variability, suggesting right atrial pressure of 3 mmHg.    S/p TEE/DCCV 12/25/2019:  EF 40%, LAA without thrombus, bubble study negative for shunting, no significant valvular abnormalities. Underwent successful DCCV with single shock of 120J with conversion to NSR. Recurrent Afib next day on 12/26/19.  Nuclear stress test 12/25/2019:  Small fixed apical defect likely artifact. No ischemia. EF 40% with globally   s/p successful DCCV 02/28/20.  Recurrent Afib 03/13/20. Digoxin increased to 250mcg po daily  05/05/20: s/p EP study and cryoballoon ablation CTI/PVI,  additional ablation in left atrial roof line, proximal and mid coronary sinus, appendage side between veins by Dr. Perri.    2D echo 10/16/2018 Study Conclusions  - Left ventricle: The cavity size was normal. Wall thickness was    normal. Systolic function was normal. The estimated ejection    fraction was in the range of 55% to 65%.  - Mitral valve: There was mild regurgitation.    Risk Assessment/Calculations  CHA2DS2-VASc Score = 4   This indicates a 4.8% annual risk of stroke. The patient's score is based upon: CHF History: 1 HTN History: 1 Diabetes History: 1 Stroke History: 0 Vascular Disease History: 0 Age Score: 1 Gender Score: 0            Physical Exam VS:  BP 112/78   Pulse 95   Ht 5' 8 (1.727 m)   Wt 258 lb 6.4 oz (117.2 kg)   SpO2 96%   BMI 39.29 kg/m        Wt Readings from Last 3 Encounters:  08/27/24 258 lb 6.4 oz (117.2 kg)  07/16/24 259 lb (117.5 kg)  06/05/24 265 lb (120.2 kg)    GEN: Well nourished, well developed in no acute distress NECK: No JVD; No carotid bruits CARDIAC: RRR, no murmurs, rubs, gallops RESPIRATORY:  Clear to auscultation without rales, wheezing or  rhonchi  ABDOMEN: Soft, non-tender, non-distended EXTREMITIES:  No edema; No deformity   ASSESSMENT AND PLAN Paroxysmal atrial fibrillation/atrial flutter status post DCCV with history of atrial fibrillation ablation in 2021 multiple DCCV's.  Maintaining sinus today with PACs on EKG.  He has been continued on rivaroxaban  20 mg daily for CHA2DS2-VASc of at least 4 for stroke prophylaxis.  His metoprolol  has been increased to 200 mg daily for his resting heart rates of 90.  He states that he does not need refill of his metoprolol  sent in today.  He is to continue to monitor his heart rate at home.  HFimpEF with last echocardiogram revealed LVEF and improved LVEF from 40% to 60 to 65%, no RWMA, G1 DD.  Denies any recurrent shortness of breath.  He is continued on Toprol -XL and furosemide as needed  as well as Mounjaro .  He is also on losartan 50 mg daily.  Not an ideal candidate for MRA therapy or SGLT2 inhibitor therapy at this time.  Recurrent pulmonary embolism.  Previously had saddle PE and underwent thrombectomy and continues to be followed by vascular.  Continues to be maintained on rivaroxaban  20 mg daily indefinitely.  Primary hypertension with a blood pressure today of 112/78.  Blood pressure has been well-controlled.  He is continued on his current medication regimen at this time.  His Toprol  was increased today.  He has been encouraged to continue to monitor his pressure 1 to 2 hours postmedication administration at home as well.  Mixed hyperlipidemia associated type 2 diabetes continued on ezetimibe  10 mg daily with ongoing management of his hyperlipidemia and diabetes by his PCP.  Morbid obesity with a BMI today of 39.29.  He is continued on Mounjaro .  Ongoing management per PCP.  He has also been encouraged to continue with lifestyle and dietary modifications as weight loss is beneficial to his overall prognosis and health.  Bilateral lower extremity edema likely a component of venous  insufficiency where he continues to be encouraged to participate in conservative therapy.       Dispo: Patient to return to clinic to see MD/APP in 8 to 10 weeks or sooner if needed for reevaluation or sooner if needed.  Signed, Alleigh Mollica, NP

## 2024-09-03 ENCOUNTER — Encounter: Attending: Physician Assistant | Admitting: Physician Assistant

## 2024-09-10 ENCOUNTER — Encounter: Admitting: Physician Assistant

## 2024-09-10 DIAGNOSIS — I87332 Chronic venous hypertension (idiopathic) with ulcer and inflammation of left lower extremity: Secondary | ICD-10-CM | POA: Diagnosis not present

## 2024-11-06 ENCOUNTER — Ambulatory Visit: Admitting: Cardiology

## 2024-11-06 ENCOUNTER — Encounter: Payer: Self-pay | Admitting: Cardiology

## 2024-11-06 VITALS — BP 138/91 | HR 98 | Ht 67.0 in | Wt 250.8 lb

## 2024-11-06 DIAGNOSIS — E1169 Type 2 diabetes mellitus with other specified complication: Secondary | ICD-10-CM | POA: Diagnosis present

## 2024-11-06 DIAGNOSIS — I480 Paroxysmal atrial fibrillation: Secondary | ICD-10-CM | POA: Diagnosis not present

## 2024-11-06 DIAGNOSIS — I10 Essential (primary) hypertension: Secondary | ICD-10-CM | POA: Diagnosis present

## 2024-11-06 DIAGNOSIS — Z86711 Personal history of pulmonary embolism: Secondary | ICD-10-CM | POA: Insufficient documentation

## 2024-11-06 DIAGNOSIS — R600 Localized edema: Secondary | ICD-10-CM | POA: Insufficient documentation

## 2024-11-06 DIAGNOSIS — E782 Mixed hyperlipidemia: Secondary | ICD-10-CM | POA: Insufficient documentation

## 2024-11-06 DIAGNOSIS — I5020 Unspecified systolic (congestive) heart failure: Secondary | ICD-10-CM | POA: Diagnosis present

## 2024-11-06 NOTE — Patient Instructions (Signed)
 Medication Instructions:  Your physician recommends that you continue on your current medications as directed. Please refer to the Current Medication list given to you today.   *If you need a refill on your cardiac medications before your next appointment, please call your pharmacy*  Lab Work: No labs ordered today  If you have labs (blood work) drawn today and your tests are completely normal, you will receive your results only by: MyChart Message (if you have MyChart) OR A paper copy in the mail If you have any lab test that is abnormal or we need to change your treatment, we will call you to review the results.  Testing/Procedures: No test ordered today   Follow-Up: At [facility], you and your health needs are our priority.  As part of our continuing mission to provide you with exceptional heart care, our providers are all part of one team.  This team includes your primary Cardiologist (physician) and Advanced Practice Providers or APPs (Physician Assistants and Nurse Practitioners) who all work together to provide you with the care you need, when you need it.  Your next appointment:   6 month(s)  Provider:   Tylene Lunch, NP  We recommend signing up for the patient portal called MyChart.  Sign up information is provided on this After Visit Summary.  MyChart is used to connect with patients for Virtual Visits (Telemedicine).  Patients are able to view lab/test results, encounter notes, upcoming appointments, etc.  Non-urgent messages can be sent to your provider as well.   To learn more about what you can do with MyChart, go to [email or web address].

## 2024-11-06 NOTE — Progress Notes (Signed)
 " Cardiology Office Note   Date:  11/06/2024  ID:  Dwayne James, DOB 02-01-1956, MRN [id number] PCP: Harriette PONCE Norleen LELON, MD  [facility] Providers Cardiologist:  None Cardiology APP:  Gerard Frederick, NP     History of Present Illness Dwayne James is a 69 y.o. 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   Previously had been followed by Dr Charlanne at [facility].  He had previously been found to be in persistent atrial fibrillation with intermittent RVR where he was extremely symptomatic in 2021.  He underwent TEE/DCCV 12/25/2019 with an LVEF of 40%, LAA without thrombus, bubble study negative for shunting, no significant valvular abnormalities.  Underwent successful DCCV with single shock 120 J with conversion to normal sinus rhythm.  Unfortunately recurrent atrial fibrillation the next day.  He underwent nuclear stress testing in 12/2019 which revealed small fixed apical defect likely artifact.  No ischemia, EF 40% with global hypokinesis.  He was noted to have a CHA2DS2-VASc score of 5 and had previously been on apixaban  but discontinued apixaban  on his own due to co-pay.  In April 2021 he underwent successful DCCV with recurrent atrial fibrillation noted 03/13/2024.  His digoxin was increased to 250 mcg daily.  He continued to remain asymptomatic with persistent atrial fibrillation and intermittent episodes of RVR.  He was loaded on amiodarone and was referred to EP for ablation procedure.  In July 2021 he was status post EP study with a cryoballoon ablation CTI/PVI, additional ablation in the left atrial roofline, proximal to mid coronary sinus, appendage side between veins and by Dr.  Perri.  There was no reoccurrence of atrial fibrillation after his ablation procedure.  History noted of PE 09/2018 requiring suction removal.  He was noncompliant with anticoagulation (had declined warfarin and was only taken aspirin  therapy) and had recurrent saddle PE in April 2023 while off of [facility] and only amendable to aspirin  therapy.  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  Hepatic continued on rivaroxaban  since that time.  Repeat echocardiogram revealed an LVEF of 60-65%, no RWMA, G1 DD, enlarged RV with moderately reduced RV systolic function, moderately dilated RA, mild MR, and normal inferior vena cava size.  He was last evaluated by cardiology 06/16/2023 stating he was back to his usual state of health.  He had stopped taking Ozempic since he did not see benefit.  His weight had increased back from 12/05/2018 2-50.  Reported depression symptoms were well-controlled with current medications and routine therapy.  Remains stable functionally.  Continues to deny any recurrent exertional chest discomfort, shortness of breath, palpitations, nausea/vomiting, lightheadedness, dizziness, or syncope.  Had moderate persistent lower extremity edema for few months.  Had not taken Lasix in quite some time.  Denies any significant OSA symptoms.  Continued to drink alcohol daily and also stated he was compliant with oral anticoagulant therapy.  He was encouraged to minimize his alcohol intake.  Continued on metoprolol  succinate 50 mg daily, rivaroxaban  20 mg daily, and scheduled for routine labs.  There were no other procedures that were ordered at that time.  He was also encouraged to take Lasix 20 mg daily for 3 days and  then as needed.  Results of his BNP and BNP returned and he was advised to participate in conservative therapy that chronic diuretic therapy was not  indicated.  He was previously seen in clinic 7/27 showed an elevated heart rate 7-ongoing for least 1 to 2 months.  He stated that he had been mailed a ZIO monitor which she was wearing during his visit.  His metoprolol  was increased to 75 mg daily.  EKG revealed that he was in atrial flutter with a varying AV block with a rate of 132.  He was sent for updated labs.  He was scheduled for an updated echocardiogram for new onset atrial flutter.  He was scheduled for cardioversion and discussed if he had recurrence of atrial flutter he would be referred back to EP for potential flutter ablation.  He underwent cardioversion procedure on 06/05/2024 where he converted after being shocked 250 J and converted to normal sinus rhythm.  He tolerated the procedure well and there were no immediate postoperative complications.  He was evaluated in clinic 07/16/2024 overall doing well from a cardiac perspective.  Resting heart rates at home he stated were in the 80s and 90s.  He been compliant with his medications without any undue side effects.  Had not had any missed doses of rivaroxaban  denied any blood in his urine or stool.  He was last seen in clinic 08/27/2024 doing well from a cardiac perspective.  Denies chest pain or worsening shortness of breath.  Resting heart rates slightly improved still noted to be 70-90 at home.  He had not missed any rivaroxaban  denied any issues with bleeding.  Had both of his lower extremities wrapped and was following with wound clinic.   He returns to clinic today stating that overall he has been doing well from a cardiac perspective.  He denies any chest pain or worsening shortness of breath.  States that he has finished with follow-ups with the wound clinic and his legs have healed with no further issues.  States that he has been compliant with his current medication regimen without any undue side effects.  Denies any missed doses of rivaroxaban .  Denies any bleeding with no blood noted  in the urine or stool.  States that he also just recently had labs with his PCP completed.  Denies any recent hospitalizations or visits to the emergency department.  ROS: 10 point review of system has been reviewed and considered negative the exception was been listed in the HPI  Studies Reviewed EKG Interpretation Date/Time:  Wednesday November 06 2024 11:28:52 EST Ventricular Rate:  98 PR Interval:  136 QRS Duration:  76 QT Interval:  352 QTC Calculation: 449 R Axis:   -18  Text Interpretation: Normal sinus rhythm Low voltage QRS Artifact When compared with ECG of 27-Aug-2024 10:23, Confirmed by Gerard Frederick ([id number]) on 11/06/2024 1:44:26 PM    2d echo 07/12/2024 1. Left ventricular ejection fraction, by estimation, is 60 to 65%. The  left ventricle has normal function. The left ventricle has no regional  wall motion abnormalities. Left ventricular diastolic parameters are  consistent with Grade I diastolic  dysfunction (impaired relaxation).   2. Right ventricular systolic function is normal. The right ventricular  size is normal.   3. The mitral valve is normal in structure. No evidence of mitral valve  regurgitation.   4. The aortic valve is tricuspid. Aortic valve regurgitation is not  visualized. Aortic valve sclerosis is present, with no evidence of aortic  valve stenosis.  5. The inferior vena cava is normal in size with greater than 50%  respiratory variability, suggesting right atrial pressure of 3 mmHg.    2D echo 02/25/2022 1. Left ventricular ejection fraction, by estimation, is 60 to 65%. The  left ventricle has normal function. The left ventricle has no regional  wall motion abnormalities. Left ventricular diastolic parameters are  consistent with Grade I diastolic  dysfunction (impaired relaxation).   2. Right ventricular systolic function is moderately reduced. The right  ventricular size is severely enlarged. There is normal pulmonary artery  systolic  pressure. The estimated right ventricular systolic pressure is  34.6 mmHg.   3. Right atrial size was moderately dilated.   4. The mitral valve is normal in structure. Mild mitral valve  regurgitation.   5. The aortic valve is tricuspid. Aortic valve regurgitation is not  visualized. Aortic valve sclerosis is present, with no evidence of aortic  valve stenosis.   6. The inferior vena cava is normal in size with greater than 50%  respiratory variability, suggesting right atrial pressure of 3 mmHg.    S/p TEE/DCCV 12/25/2019:  EF 40%, LAA without thrombus, bubble study negative for shunting, no significant valvular abnormalities. Underwent successful DCCV with single shock of 120J with conversion to NSR. Recurrent Afib next day on 12/26/19.  Nuclear stress test 12/25/2019:  Small fixed apical defect likely artifact. No ischemia. EF 40% with globally   s/p successful DCCV 02/28/20.  Recurrent Afib 03/13/20. Digoxin increased to 250mcg po daily  05/05/20: s/p EP study and cryoballoon ablation CTI/PVI,  additional ablation in left atrial roof line, proximal and mid coronary sinus, appendage side between veins by Dr. Perri.    2D echo 10/16/2018 Study Conclusions  - Left ventricle: The cavity size was normal. Wall thickness was    normal. Systolic function was normal. The estimated ejection    fraction was in the range of 55% to 65%.  - Mitral valve: There was mild regurgitation.   Risk Assessment/Calculations  CHA2DS2-VASc Score = 4   This indicates a 4.8% annual risk of stroke. The patient's score is based upon: CHF History: 1 HTN History: 1 Diabetes History: 1 Stroke History: 0 Vascular Disease History: 0 Age Score: 1 Gender Score: 0        Physical Exam VS:  BP (!) 138/91 (BP Location: Left Arm, Patient Position: Sitting, Cuff Size: Normal)   Pulse 98 Comment: 102 oximeter  Ht 5' 7 (1.702 m)   Wt 250 lb 12.8 oz (113.8 kg)   SpO2 96%   BMI 39.28 kg/m        Wt Readings  from Last 3 Encounters:  11/06/24 250 lb 12.8 oz (113.8 kg)  08/27/24 258 lb 6.4 oz (117.2 kg)  07/16/24 259 lb (117.5 kg)    GEN: Well nourished, well developed in no acute distress NECK: No JVD; No carotid bruits CARDIAC: RRR, no murmurs, rubs, gallops RESPIRATORY:  Clear to auscultation without rales, wheezing or rhonchi  ABDOMEN: Soft, non-tender, obese, non-distended EXTREMITIES: Trace pretibial edema; No deformity   ASSESSMENT AND PLAN Paroxysmal atrial fibrillation/atrial flutter status post DCCV with a history of atrial fibrillation ablation in 2021 multiple cardioversion procedures.  He continues to maintain sinus rhythm today with EKG revealing sinus rhythm with a rate of 98 with no acute ischemic changes.  He has continued on rivaroxaban  20 mg daily for CHA2DS2-VASc or at least 4 for stroke prophylaxis.  He has been continued on Toprol -XL 200 mg daily.  HFimpEF  was last echocardiogram: LVEF with improved LVEF from 40% to 60 to 65%, no RWMA, G1 DD.  Denies any change in his chronic shortness of breath.  He is continued on Toprol  and furosemide as needed and Mounjaro .  He is on losartan but he is not an ideal candidate for MRA therapy or SGLT2 inhibitor therapy.  Recurrent pulmonary embolism he previously had saddle PE and underwent thrombectomy and continues to be followed by vascular surgery.  Continues to be maintained on rivaroxaban  20 mg indefinitely due to paroxysmal atrial fibrillation history and recurrent PEs.  Primary hypertension with a blood pressure today 138/91.  Unfortunately he has not had any of his medications prior to his visit today show his blood pressure is slightly elevated.  He is continued on furosemide 20 mg daily, losartan 50 mg daily, Toprol -XL 200 mg daily.  He has been encouraged to continue to monitor his pressures 1 to 2 hours postmedication administration at home as well.  Mixed hyperlipidemia associated with type 2 diabetes where he has been continued  on ezetimibe  10 mg daily, we request his most recent labs from his PCP as he states he just had labs completed.  Ongoing management of his diabetes by his PCP.  Morbid obesity with a BMI of 39.28.  He is continued on GLP-1.  Weight loss would be beneficial as this complicates his overall prognosis and health.  Ongoing management per PCP.  Bilateral lower extremity edema likely with component of venous insufficiency.  Previously was weeping to the bilateral lower extremities and was having his legs wrapped and bandaged by wound care.  No longer wrapped today and no further evaluation by wound care needed.       Dispo: Patient to return to clinic to see MD/APP in 6 months or sooner if needed for further evaluation.  Signed, Narcisa Ganesh, NP   "
